# Patient Record
Sex: Female | Born: 1955 | Race: White | Hispanic: No | Marital: Married | Smoking: Former smoker
Health system: Southern US, Community
[De-identification: ages and names within clinical notes are randomized; demographics above are authoritative.]

## PROBLEM LIST (undated history)

## (undated) DIAGNOSIS — I639 Cerebral infarction, unspecified: Secondary | ICD-10-CM

## (undated) DIAGNOSIS — E119 Type 2 diabetes mellitus without complications: Secondary | ICD-10-CM

## (undated) DIAGNOSIS — K219 Gastro-esophageal reflux disease without esophagitis: Secondary | ICD-10-CM

## (undated) DIAGNOSIS — N184 Chronic kidney disease, stage 4 (severe): Secondary | ICD-10-CM

## (undated) DIAGNOSIS — G43909 Migraine, unspecified, not intractable, without status migrainosus: Secondary | ICD-10-CM

## (undated) DIAGNOSIS — E1129 Type 2 diabetes mellitus with other diabetic kidney complication: Secondary | ICD-10-CM

## (undated) DIAGNOSIS — I10 Essential (primary) hypertension: Secondary | ICD-10-CM

## (undated) DIAGNOSIS — E785 Hyperlipidemia, unspecified: Secondary | ICD-10-CM

## (undated) DIAGNOSIS — N289 Disorder of kidney and ureter, unspecified: Secondary | ICD-10-CM

## (undated) HISTORY — PX: APPENDECTOMY: SHX54

## (undated) HISTORY — PX: ABDOMINAL HYSTERECTOMY: SHX81

## (undated) HISTORY — PX: BREAST LUMPECTOMY: SHX2

## (undated) HISTORY — PX: BREAST EXCISIONAL BIOPSY: SUR124

---
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
## 2019-10-15 ENCOUNTER — Emergency Department
Admission: EM | Admit: 2019-10-15 | Discharge: 2019-10-15 | Disposition: A | Discharge status: Home / Self Care | Payer: Medicare HMO | Attending: Emergency Medicine | Admitting: Emergency Medicine

## 2019-10-15 ENCOUNTER — Other Ambulatory Visit: Payer: Self-pay

## 2019-10-15 ENCOUNTER — Emergency Department: Payer: Medicare HMO

## 2019-10-15 DIAGNOSIS — R531 Weakness: Secondary | ICD-10-CM

## 2019-10-15 DIAGNOSIS — Z87891 Personal history of nicotine dependence: Secondary | ICD-10-CM | POA: Insufficient documentation

## 2019-10-15 DIAGNOSIS — F1210 Cannabis abuse, uncomplicated: Secondary | ICD-10-CM | POA: Insufficient documentation

## 2019-10-15 DIAGNOSIS — I10 Essential (primary) hypertension: Secondary | ICD-10-CM | POA: Insufficient documentation

## 2019-10-15 DIAGNOSIS — E119 Type 2 diabetes mellitus without complications: Secondary | ICD-10-CM | POA: Insufficient documentation

## 2019-10-15 HISTORY — DX: Essential (primary) hypertension: I10

## 2019-10-15 LAB — BASIC METABOLIC PANEL
Anion gap: 6 (ref 5–15)
BUN: 30 mg/dL — ABNORMAL HIGH (ref 8–23)
CO2: 24 mmol/L (ref 22–32)
Calcium: 8.2 mg/dL — ABNORMAL LOW (ref 8.9–10.3)
Chloride: 110 mmol/L (ref 98–111)
Creatinine, Ser: 1.41 mg/dL — ABNORMAL HIGH (ref 0.44–1.00)
GFR calc Af Amer: 45 mL/min — ABNORMAL LOW (ref >60)
GFR calc non Af Amer: 39 mL/min — ABNORMAL LOW (ref >60)
Glucose, Bld: 95 mg/dL (ref 70–99)
Potassium: 3.8 mmol/L (ref 3.5–5.1)
Sodium: 140 mmol/L (ref 135–145)

## 2019-10-15 LAB — GLUCOSE, CAPILLARY
Glucose-Capillary: 126 mg/dL — ABNORMAL HIGH (ref 70–99)
Glucose-Capillary: 47 mg/dL — ABNORMAL LOW (ref 70–99)

## 2019-10-15 LAB — TROPONIN I (HIGH SENSITIVITY)
Troponin I (High Sensitivity): 3 ng/L (ref <18)
Troponin I (High Sensitivity): 4 ng/L (ref <18)

## 2019-10-15 MED ORDER — DEXTROSE 50 % IV SOLN
INTRAVENOUS | Status: AC
  Filled 2019-10-15: qty 50

## 2019-10-15 MED ORDER — DEXTROSE 50 % IV SOLN
1.0000 | Freq: Once | INTRAVENOUS | Status: AC
  Administered 2019-10-15: 50 mL via INTRAVENOUS

## 2019-10-15 MED ORDER — SODIUM CHLORIDE 0.9 % IV BOLUS
1000.0000 mL | Freq: Once | INTRAVENOUS | Status: AC
  Administered 2019-10-15: 1000 mL via INTRAVENOUS

## 2019-10-15 NOTE — ED Notes (Signed)
Assumed care of patient reports went to take her son to an appoint over at the clinic and was feeling dizzy. Was noted to having low b/p and low glucose. Patient reports eating this morning with known history of high blood glucose  and high b/p.

## 2019-10-15 NOTE — ED Provider Notes (Signed)
[facility] Emergency Department Provider Note  Time seen: 1:46 PM  I have reviewed the triage vital signs and the nursing notes.   HISTORY  Chief Complaint Weakness Hypotension  HPI Mariah Forbes is a 64 y.o. female with a past medical history of diabetes, hypertension, presents to the emergency department for a low blood sugar and low blood pressure.  According to the patient for the past week or so she has been feeling somewhat fatigued.  States she was experiencing a vomiting and diarrheal illness 1 week ago but has been taking Zofran and has been feeling better since then.  Patient states she was feeling somewhat shaky this morning her son had an appointment at 930 this morning to be seen at [facility] clinic so she went with her son to be seen as well.  Patient was found to have a lower blood sugar of 39 at the clinic as well as a lower blood pressure around 90 systolic.  Patient was brought to the emergency department for further work-up.  Patient's blood pressure had improved without intervention upon arrival to the emergency department.  Blood sugar remained low.  Patient states she did eat a biscuit this morning.  Took her glipizide this morning and Lantus last night.  Denies any fever cough or shortness of breath.  Patient had lab work performed at [facility] prior to transfer to the emergency department.   Past Medical History:  Diagnosis Date  . Diabetes mellitus without complication ([place])   . Hypertension   . Renal disorder     There are no problems to display for this patient.   Past Surgical History:  Procedure Laterality Date  . ABDOMINAL HYSTERECTOMY    . APPENDECTOMY    . BREAST LUMPECTOMY Right     Prior to Admission medications   Not on File    No Known Allergies  History reviewed. No pertinent family history.  Social History Social History   Tobacco Use  . Smoking status: Former Research scientist (life sciences)  . Smokeless tobacco: Never Used   Substance Use Topics  . Alcohol use: Yes  . Drug use: Yes    Frequency: 1.0 times per week    Types: Marijuana    Review of Systems Constitutional: Negative for fever. Cardiovascular: Negative for chest pain. Respiratory: Negative for shortness of breath. Gastrointestinal: Negative for abdominal pain.  Nausea vomiting last week but none since.  Negative for diarrhea. Genitourinary: Negative for urinary compaints Musculoskeletal: Negative for musculoskeletal complaints Neurological: Negative for headache All other ROS negative  ____________________________________________   PHYSICAL EXAM:  VITAL SIGNS: ED Triage Vitals  Enc Vitals Group     BP 10/15/19 1145 122/61     Pulse Rate 10/15/19 1145 70     Resp 10/15/19 1145 18     Temp 10/15/19 1145 97.9 F (36.6 C)     Temp Source 10/15/19 1145 Oral     SpO2 10/15/19 1145 100 %     Weight 10/15/19 1146 200 lb (90.7 kg)     Height 10/15/19 1146 5\' 3"  (1.6 m)     Head Circumference --      Peak Flow --      Pain Score 10/15/19 1146 0     Pain Loc --      Pain Edu? --      Excl. in [place]? --    Constitutional: Alert and oriented. Well appearing and in no distress. Eyes: Normal exam ENT      Head: Normocephalic  and atraumatic.      Mouth/Throat: Mucous membranes are moist. Cardiovascular: Normal rate, regular rhythm. Respiratory: Normal respiratory effort without tachypnea nor retractions. Breath sounds are clear Gastrointestinal: Soft and nontender. No distention.  Musculoskeletal: Nontender with normal range of motion in all extremities. Neurologic:  Normal speech and language. No gross focal neurologic deficits  Skin:  Skin is warm, dry and intact.  Psychiatric: Mood and affect are normal.   ____________________________________________    EKG  EKG viewed and interpreted by myself shows a sinus rhythm at 69 bpm with a narrow QRS, normal axis, normal intervals, besides slightly prolonged PR interval, no ST  changes.  ____________________________________________    RADIOLOGY  IMPRESSION:  Lungs clear. Cardiac silhouette within normal limits.   ____________________________________________   INITIAL IMPRESSION / ASSESSMENT AND PLAN / ED COURSE  Pertinent labs & imaging results that were available during my care of the patient were reviewed by me and considered in my medical decision making (see chart for details).   Patient presents from [facility] with low blood sugar and low blood pressure.  Blood pressure had improved without intervention.  Patient's blood sugar was low given an amp of D50 and orange juice.  Patient given a liter of fluids in the emergency department.  I reviewed the patient's lab work showed renal insufficiency with a creatinine 1.5 however this is largely unchanged from patient's past lab work from 2019 and 18.  Patient's blood sugar was low, however on CBG recheck it is 126 after intervention.  Patient states she is feeling much better.  We will check a urine sample.  Patient received a Covid swab this morning at [facility].  States she has been experiencing some mild shortness of breath over the past week or 2 but denies any currently.  Denies any chest pain at any point.  Troponin is negative.  EKG is reassuring.  We will recheck a BMP after 1 L of fluids, check a chest x-ray, and continue to closely monitor.  Patient states she is feeling much better and is anxious to get home to continue on packing as she just moved to the area yesterday.  Patient is chest x-ray is clear.  BMP is improved blood glucose remains greater than 90.  Patient is requesting discharge home.  I believe the patient is safe for discharge home.  I discussed with the patient plenty of fluids and she needs to continue to check her blood glucose at home.  Patient agreeable to plan of care.  Mariah Forbes was evaluated in Emergency Department on 10/15/2019 for the symptoms described in the  history of present illness. She was evaluated in the context of the global COVID-19 pandemic, which necessitated consideration that the patient might be at risk for infection with the SARS-CoV-2 virus that causes COVID-19. Institutional protocols and algorithms that pertain to the evaluation of patients at risk for COVID-19 are in a state of rapid change based on information released by regulatory bodies including the CDC and federal and state organizations. These policies and algorithms were followed during the patient's care in the ED.  ____________________________________________   FINAL CLINICAL IMPRESSION(S) / ED DIAGNOSES  Hypoglycemia Weakness   Harvest Dark, MD 10/15/19 1427

## 2019-10-15 NOTE — ED Triage Notes (Signed)
First Nurse Note:  Arrives from [facility] for evaluation of elevated WBC and Hypotension.  Patient is AAOx3. Skin warm and dry. NAD

## 2019-10-15 NOTE — ED Notes (Signed)
Labs obtained and sent.

## 2019-10-15 NOTE — Discharge Instructions (Signed)
As discussed please drink plenty of fluids.  Please eat throughout the day to maintain your blood glucose.  Please check your blood glucose at least 4 times daily for the next 2 days.  Return to the emergency department for any episodes of sustained low blood sugar, any further weakness, or any other symptom personally concerning to yourself.

## 2019-10-15 NOTE — ED Notes (Addendum)
Orders per Dr. Kerman Passey since pt labs done at [facility] about an hour ago.

## 2019-10-15 NOTE — ED Notes (Signed)
Pt given one cup orange juice.

## 2019-10-15 NOTE — ED Triage Notes (Signed)
Pt comes for [facility] with hypotension, low CBG at 39 per pt, dizziness. Pt AOx4 at this time.

## 2019-11-13 ENCOUNTER — Other Ambulatory Visit: Payer: Self-pay

## 2019-11-13 ENCOUNTER — Ambulatory Visit: Payer: Medicare HMO | Attending: Internal Medicine

## 2019-11-13 DIAGNOSIS — Z23 Encounter for immunization: Secondary | ICD-10-CM

## 2019-11-13 NOTE — Progress Notes (Signed)
   Covid-19 Vaccination Clinic  Name:  Mariah Forbes    MRN: [id number] DOB: 07-30-1955  11/13/2019  [name] was observed post Covid-19 immunization for 15 minutes without incident. She was provided with Vaccine Information Sheet and instruction to access the V-Safe system.   [name] was instructed to call 911 with any severe reactions post vaccine: [name] Kitchen Difficulty breathing  . Swelling of face and throat  . A fast heartbeat  . A bad rash all over body  . Dizziness and weakness   Immunizations Administered    Name Date Dose VIS Date Route   Pfizer COVID-19 Vaccine 11/13/2019 10:49 AM 0.3 mL 09/10/2018 Intramuscular   Manufacturer: [employer], [employer]   Lot: [id number]   [place]: [id number]

## 2019-11-18 ENCOUNTER — Other Ambulatory Visit: Payer: Self-pay | Admitting: Gastroenterology

## 2019-11-18 DIAGNOSIS — R1319 Other dysphagia: Secondary | ICD-10-CM

## 2019-11-18 DIAGNOSIS — R1310 Dysphagia, unspecified: Secondary | ICD-10-CM

## 2019-11-24 ENCOUNTER — Ambulatory Visit
Admission: RE | Admit: 2019-11-24 | Discharge: 2019-11-24 | Disposition: A | Discharge status: Home / Self Care | Payer: Medicare HMO | Source: Ambulatory Visit | Attending: Gastroenterology | Admitting: Gastroenterology

## 2019-11-24 ENCOUNTER — Other Ambulatory Visit: Payer: Self-pay

## 2019-11-24 DIAGNOSIS — R1310 Dysphagia, unspecified: Secondary | ICD-10-CM | POA: Diagnosis not present

## 2019-11-24 DIAGNOSIS — R1319 Other dysphagia: Secondary | ICD-10-CM

## 2019-12-09 ENCOUNTER — Ambulatory Visit: Payer: Medicare HMO | Attending: Internal Medicine

## 2019-12-09 DIAGNOSIS — Z23 Encounter for immunization: Secondary | ICD-10-CM

## 2019-12-09 NOTE — Progress Notes (Signed)
° °  Covid-19 Vaccination Clinic  Name:  Mariah Forbes    MRN: [id number] DOB: 11-04-1955  12/09/2019  Ms. Mariah Forbes was observed post Covid-19 immunization for 15 minutes without incident. She was provided with Vaccine Information Sheet and instruction to access the V-Safe system.   Ms. Mariah Forbes was instructed to call 911 with any severe reactions post vaccine:  Difficulty breathing   Swelling of face and throat   A fast heartbeat   A bad rash all over body   Dizziness and weakness   Immunizations Administered    Name Date Dose VIS Date Route   Pfizer COVID-19 Vaccine 12/09/2019 10:13 AM 0.3 mL 09/10/2018 Intramuscular   Manufacturer: [place]   Lot: [id number]   [place]: [id number]

## 2020-02-09 DIAGNOSIS — E1165 Type 2 diabetes mellitus with hyperglycemia: Secondary | ICD-10-CM | POA: Diagnosis not present

## 2020-02-09 DIAGNOSIS — L659 Nonscarring hair loss, unspecified: Secondary | ICD-10-CM | POA: Diagnosis not present

## 2020-02-09 DIAGNOSIS — Z1231 Encounter for screening mammogram for malignant neoplasm of breast: Secondary | ICD-10-CM | POA: Diagnosis not present

## 2020-02-09 DIAGNOSIS — Z114 Encounter for screening for human immunodeficiency virus [HIV]: Secondary | ICD-10-CM | POA: Diagnosis not present

## 2020-02-09 DIAGNOSIS — Z794 Long term (current) use of insulin: Secondary | ICD-10-CM | POA: Diagnosis not present

## 2020-02-09 DIAGNOSIS — R42 Dizziness and giddiness: Secondary | ICD-10-CM | POA: Diagnosis not present

## 2020-02-09 DIAGNOSIS — E0865 Diabetes mellitus due to underlying condition with hyperglycemia: Secondary | ICD-10-CM | POA: Diagnosis not present

## 2020-02-09 DIAGNOSIS — I10 Essential (primary) hypertension: Secondary | ICD-10-CM | POA: Diagnosis not present

## 2020-02-09 DIAGNOSIS — Z1159 Encounter for screening for other viral diseases: Secondary | ICD-10-CM | POA: Diagnosis not present

## 2020-02-12 ENCOUNTER — Other Ambulatory Visit: Payer: Self-pay | Admitting: Internal Medicine

## 2020-02-12 DIAGNOSIS — Z1231 Encounter for screening mammogram for malignant neoplasm of breast: Secondary | ICD-10-CM

## 2020-03-01 DIAGNOSIS — E113313 Type 2 diabetes mellitus with moderate nonproliferative diabetic retinopathy with macular edema, bilateral: Secondary | ICD-10-CM | POA: Diagnosis not present

## 2020-03-03 ENCOUNTER — Other Ambulatory Visit: Payer: Self-pay

## 2020-03-03 ENCOUNTER — Ambulatory Visit
Admission: RE | Admit: 2020-03-03 | Discharge: 2020-03-03 | Disposition: A | Discharge status: Home / Self Care | Payer: Medicare HMO | Source: Ambulatory Visit | Attending: Internal Medicine | Admitting: Internal Medicine

## 2020-03-03 DIAGNOSIS — Z1231 Encounter for screening mammogram for malignant neoplasm of breast: Secondary | ICD-10-CM | POA: Diagnosis not present

## 2020-04-12 DIAGNOSIS — N3281 Overactive bladder: Secondary | ICD-10-CM | POA: Diagnosis not present

## 2020-04-12 DIAGNOSIS — M545 Low back pain: Secondary | ICD-10-CM | POA: Diagnosis not present

## 2020-04-12 DIAGNOSIS — G8929 Other chronic pain: Secondary | ICD-10-CM | POA: Diagnosis not present

## 2020-04-12 DIAGNOSIS — R202 Paresthesia of skin: Secondary | ICD-10-CM | POA: Diagnosis not present

## 2020-04-12 DIAGNOSIS — E559 Vitamin D deficiency, unspecified: Secondary | ICD-10-CM | POA: Diagnosis not present

## 2020-04-12 DIAGNOSIS — Z794 Long term (current) use of insulin: Secondary | ICD-10-CM | POA: Diagnosis not present

## 2020-04-12 DIAGNOSIS — E1122 Type 2 diabetes mellitus with diabetic chronic kidney disease: Secondary | ICD-10-CM | POA: Diagnosis not present

## 2020-04-12 DIAGNOSIS — N1831 Chronic kidney disease, stage 3a: Secondary | ICD-10-CM | POA: Diagnosis not present

## 2020-04-29 DIAGNOSIS — Z20822 Contact with and (suspected) exposure to covid-19: Secondary | ICD-10-CM | POA: Diagnosis not present

## 2020-04-29 DIAGNOSIS — Z01812 Encounter for preprocedural laboratory examination: Secondary | ICD-10-CM | POA: Diagnosis not present

## 2020-05-04 ENCOUNTER — Other Ambulatory Visit: Payer: Self-pay

## 2020-05-04 ENCOUNTER — Emergency Department
Admission: EM | Admit: 2020-05-04 | Discharge: 2020-05-04 | Disposition: A | Discharge status: Home / Self Care | Payer: Medicare HMO | Attending: Emergency Medicine | Admitting: Emergency Medicine

## 2020-05-04 DIAGNOSIS — Z87891 Personal history of nicotine dependence: Secondary | ICD-10-CM | POA: Diagnosis not present

## 2020-05-04 DIAGNOSIS — F1590 Other stimulant use, unspecified, uncomplicated: Secondary | ICD-10-CM | POA: Insufficient documentation

## 2020-05-04 DIAGNOSIS — E1165 Type 2 diabetes mellitus with hyperglycemia: Secondary | ICD-10-CM | POA: Diagnosis not present

## 2020-05-04 DIAGNOSIS — I10 Essential (primary) hypertension: Secondary | ICD-10-CM | POA: Diagnosis not present

## 2020-05-04 DIAGNOSIS — R197 Diarrhea, unspecified: Secondary | ICD-10-CM | POA: Insufficient documentation

## 2020-05-04 DIAGNOSIS — E860 Dehydration: Secondary | ICD-10-CM

## 2020-05-04 DIAGNOSIS — R739 Hyperglycemia, unspecified: Secondary | ICD-10-CM

## 2020-05-04 LAB — CBC
HCT: 40.6 % (ref 36.0–46.0)
Hemoglobin: 13.3 g/dL (ref 12.0–15.0)
MCH: 28.9 pg (ref 26.0–34.0)
MCHC: 32.8 g/dL (ref 30.0–36.0)
MCV: 88.1 fL (ref 80.0–100.0)
Platelets: 227 10*3/uL (ref 150–400)
RBC: 4.61 MIL/uL (ref 3.87–5.11)
RDW: 13.1 % (ref 11.5–15.5)
WBC: 11.4 10*3/uL — ABNORMAL HIGH (ref 4.0–10.5)
nRBC: 0 % (ref 0.0–0.2)

## 2020-05-04 LAB — BLOOD GAS, VENOUS
Acid-base deficit: 3.5 mmol/L — ABNORMAL HIGH (ref 0.0–2.0)
Bicarbonate: 22.1 mmol/L (ref 20.0–28.0)
FIO2: 0.21
O2 Saturation: 62.9 %
Patient temperature: 37
pCO2, Ven: 41 mmHg — ABNORMAL LOW (ref 44.0–60.0)
pH, Ven: 7.34 (ref 7.250–7.430)
pO2, Ven: 35 mmHg (ref 32.0–45.0)

## 2020-05-04 LAB — BASIC METABOLIC PANEL WITH GFR
Anion gap: 15 (ref 5–15)
BUN: 18 mg/dL (ref 8–23)
CO2: 19 mmol/L — ABNORMAL LOW (ref 22–32)
Calcium: 9.1 mg/dL (ref 8.9–10.3)
Chloride: 101 mmol/L (ref 98–111)
Creatinine, Ser: 1.36 mg/dL — ABNORMAL HIGH (ref 0.44–1.00)
GFR, Estimated: 41 mL/min — ABNORMAL LOW
Glucose, Bld: 534 mg/dL (ref 70–99)
Potassium: 3.6 mmol/L (ref 3.5–5.1)
Sodium: 135 mmol/L (ref 135–145)

## 2020-05-04 LAB — GLUCOSE, CAPILLARY: Glucose-Capillary: 456 mg/dL — ABNORMAL HIGH (ref 70–99)

## 2020-05-04 LAB — BETA-HYDROXYBUTYRIC ACID: Beta-Hydroxybutyric Acid: 0.71 mmol/L — ABNORMAL HIGH (ref 0.05–0.27)

## 2020-05-04 MED ORDER — LACTATED RINGERS IV BOLUS
1000.0000 mL | Freq: Once | INTRAVENOUS | Status: AC
  Administered 2020-05-04: 1000 mL via INTRAVENOUS

## 2020-05-04 MED ORDER — INSULIN ASPART 100 UNIT/ML ~~LOC~~ SOLN
15.0000 [IU] | Freq: Once | SUBCUTANEOUS | Status: DC
  Filled 2020-05-04: qty 1

## 2020-05-04 MED ORDER — INSULIN ASPART 100 UNIT/ML ~~LOC~~ SOLN: 0.0000 [IU] | SUBCUTANEOUS | Status: DC

## 2020-05-04 MED ORDER — INSULIN ASPART 100 UNIT/ML ~~LOC~~ SOLN
15.0000 [IU] | Freq: Once | SUBCUTANEOUS | Status: AC
  Administered 2020-05-04: 15 [IU] via SUBCUTANEOUS

## 2020-05-04 NOTE — ED Notes (Signed)
Pt states her CBG was high when she went for her colonoscopy. Pt states she hasn't had it in couple of days due to insurance not paying for it any longer. Pt states her PCP was going to change to different kind. Pt denies any other complaints.

## 2020-05-04 NOTE — ED Triage Notes (Signed)
Pt arrived via [facility] from GI clinic, pt states she was ready for a colonoscopy and her sugar at the office was 592.  Pt reports she has not taken her diabetes medications in 2 days.  Pt has been NPO for the procedure.

## 2020-05-04 NOTE — ED Notes (Signed)
CBG 531

## 2020-05-04 NOTE — ED Provider Notes (Signed)
[facility] Emergency Department Provider Note  ____________________________________________   First MD Initiated Contact with Patient 05/04/20 1335     (approximate)  I have reviewed the triage vital signs and the nursing notes.   HISTORY  Chief Complaint Hyperglycemia   HPI Mariah Forbes is a 64 y.o. female with a past medical history of HTN and  DM who presents from GI clinic where she was initially scheduled for an outpatient colonoscopy after her blood sugar was found to be 592.  Patient reports she has not taken her diabetes medications for 2 days for this procedure and has been n.p.o. since last night. She states she normally takes [place] and Victoza for her diabetes. She states other than fatigue and some increase in urinary frequency she has no other acute symptoms including headache, earache, sore throat, fevers, chills, cough, shortness of breath, nausea, vomiting, chest pain, abdominal pain, back pain, rash, extremity pain, or dysuria. She states that having completed her bowel prep she has been having some diarrhea over the last 24 hours but no blood. Denies EtOH or illicit drug use. No other acute concerns at this time. That she has an appointment with her PCP for next week.         Past Medical History:  Diagnosis Date  . Diabetes mellitus without complication ([place])   . Hypertension   . Renal disorder     There are no problems to display for this patient.   Past Surgical History:  Procedure Laterality Date  . ABDOMINAL HYSTERECTOMY    . APPENDECTOMY    . BREAST EXCISIONAL BIOPSY Bilateral    2 benign lumps, 1980 and 2015  . BREAST LUMPECTOMY Right     Prior to Admission medications   Not on File    Allergies Ace inhibitors  Family History  Problem Relation Age of Onset  . Breast cancer Neg Hx     Social History Social History   Tobacco Use  . Smoking status: Former Research scientist (life sciences)  . Smokeless tobacco: Never Used    Substance Use Topics  . Alcohol use: Yes  . Drug use: Yes    Frequency: 1.0 times per week    Types: Marijuana    Review of Systems  Review of Systems  Constitutional: Negative for chills and fever.  HENT: Negative for sore throat.   Eyes: Negative for pain.  Respiratory: Negative for cough and stridor.   Cardiovascular: Negative for chest pain.  Gastrointestinal: Positive for diarrhea. Negative for vomiting.  Genitourinary: Positive for frequency.  Musculoskeletal: Negative for myalgias.  Skin: Negative for rash.  Neurological: Negative for seizures, loss of consciousness and headaches.  Psychiatric/Behavioral: Negative for suicidal ideas.  All other systems reviewed and are negative.     ____________________________________________   PHYSICAL EXAM:  VITAL SIGNS: ED Triage Vitals  Enc Vitals Group     BP 05/04/20 1206 (!) 142/79     Pulse Rate 05/04/20 1206 86     Resp 05/04/20 1206 18     Temp 05/04/20 1206 98.2 F (36.8 C)     Temp Source 05/04/20 1206 Oral     SpO2 05/04/20 1206 100 %     Weight 05/04/20 1202 200 lb (90.7 kg)     Height 05/04/20 1202 5\' 3"  (1.6 m)     Head Circumference --      Peak Flow --      Pain Score 05/04/20 1202 0     Pain Loc --  Pain Edu? --      Excl. in [place]? --    Vitals:   05/04/20 1206  BP: (!) 142/79  Pulse: 86  Resp: 18  Temp: 98.2 F (36.8 C)  SpO2: 100%   Physical Exam Vitals and nursing note reviewed.  Constitutional:      General: She is not in acute distress.    Appearance: She is well-developed. She is obese.  HENT:     Head: Normocephalic and atraumatic.     Right Ear: External ear normal.     Left Ear: External ear normal.     Nose: Nose normal.     Mouth/Throat:     Mouth: Mucous membranes are dry.  Eyes:     Conjunctiva/sclera: Conjunctivae normal.  Cardiovascular:     Rate and Rhythm: Normal rate and regular rhythm.     Heart sounds: No murmur heard.   Pulmonary:     Effort: Pulmonary  effort is normal. No respiratory distress.     Breath sounds: Normal breath sounds.  Abdominal:     Palpations: Abdomen is soft.     Tenderness: There is no abdominal tenderness.  Musculoskeletal:     Cervical back: Neck supple.  Skin:    General: Skin is warm and dry.     Capillary Refill: Capillary refill takes 2 to 3 seconds.  Neurological:     Mental Status: She is alert and oriented to person, place, and time.  Psychiatric:        Mood and Affect: Mood normal.      ____________________________________________   LABS (all labs ordered are listed, but only abnormal results are displayed)  Labs Reviewed  BASIC METABOLIC PANEL - Abnormal; Notable for the following components:      Result Value   CO2 19 (*)    Glucose, Bld 534 (*)    Creatinine, Ser 1.36 (*)    GFR, Estimated 41 (*)    All other components within normal limits  CBC - Abnormal; Notable for the following components:   WBC 11.4 (*)    All other components within normal limits  BLOOD GAS, VENOUS - Abnormal; Notable for the following components:   pCO2, Ven 41 (*)    Acid-base deficit 3.5 (*)    All other components within normal limits  BETA-HYDROXYBUTYRIC ACID - Abnormal; Notable for the following components:   Beta-Hydroxybutyric Acid 0.71 (*)    All other components within normal limits  GLUCOSE, CAPILLARY - Abnormal; Notable for the following components:   Glucose-Capillary 456 (*)    All other components within normal limits  URINALYSIS, COMPLETE (UACMP) WITH MICROSCOPIC  CBG MONITORING, ED  CBG MONITORING, ED   ____________________________________________ ____________________________________________   PROCEDURES  Procedure(s) performed (including Critical Care):  Procedures   ____________________________________________   INITIAL IMPRESSION / ASSESSMENT AND PLAN / ED COURSE        Patient presents with above-stated history exam for assessment of elevated blood sugar obtained at GI  clinic prior to outpatient colonoscopy procedure.  Patient is hypertensive with a BP of 142/79 with otherwise stable vital signs on room air.  Labs obtained in triage show evidence of hyperglycemia with a glucose of 534 with a bicarb of 19 and a anion gap of 15.  Patient's creatinine is noted to be 1.36 which is compared to 1.46 months ago.  Patient has no other significant electrolyte or metabolic derangements.  Hydroxybutyrate is 0.71.  VBG shows no significant acidosis his pH is 7.34 with a  PCO2 of 41 and a bicarb of 22.1.  Given near normal pH with anion gap less than 15 and bicarb greater than 18 low suspicion for clinically significant DKA.  Patient does appear dehydrated on exam and she was given fluids in addition to insulin.  Likely etiology for patient's hyperglycemia is patient not taking her medications for last 2 days.  Patient denies any acute infectious symptoms and there are no obvious foci of infection on exam.  She denies any chest pain shortness of breath and of low suspicion for ACS at this time.  She has not recently been on any steroids.  On recheck patient's blood sugars were noted to be downtrending.  I explained to the patient that the likely source for her hyperglycemia and dehydration today was related to her not taking her diabetes medications and being n.p.o. and taking a bowel prep.  Advised patient to follow-up with her PCP as she will likely require closer monitoring and/or modification of her diabetes medications prior to repeat colonoscopy attempt although she should also reach out to her GI physician for further guidance.   Given stable vital signs with otherwise reassuring exam and work-up and downtrending glucose I do believe patient safe for discharge with plan for outpatient follow-up.  Advised patient is to continue taking her diabetes medications and other prescribed medications and follow-up with her PCP and GI physician.  Discharged stable  condition.  ____________________________________________   FINAL CLINICAL IMPRESSION(S) / ED DIAGNOSES  Final diagnoses:  Hyperglycemia  Dehydration    Medications  insulin aspart (novoLOG) injection 0-15 Units (has no administration in time range)  lactated ringers bolus 1,000 mL (1,000 mLs Intravenous New Bag/Given 05/04/20 1406)  insulin aspart (novoLOG) injection 15 Units (15 Units Subcutaneous Given 05/04/20 1414)     ED Discharge Orders    None       Note:  This document was prepared using Dragon voice recognition software and may include unintentional dictation errors.   Lucrezia Starch, MD 05/04/20 1425

## 2020-05-04 NOTE — ED Notes (Signed)
Tried for UA, pt missed hat.

## 2020-05-05 LAB — GLUCOSE, CAPILLARY: Glucose-Capillary: 531 mg/dL (ref 70–99)

## 2020-05-10 ENCOUNTER — Ambulatory Visit: Payer: Self-pay | Admitting: Urology

## 2020-05-10 ENCOUNTER — Ambulatory Visit: Payer: Medicare HMO | Admitting: Urology

## 2020-05-10 DIAGNOSIS — E119 Type 2 diabetes mellitus without complications: Secondary | ICD-10-CM | POA: Diagnosis not present

## 2020-05-10 DIAGNOSIS — Z23 Encounter for immunization: Secondary | ICD-10-CM | POA: Diagnosis not present

## 2020-05-10 DIAGNOSIS — N3941 Urge incontinence: Secondary | ICD-10-CM | POA: Diagnosis not present

## 2020-05-10 DIAGNOSIS — Z794 Long term (current) use of insulin: Secondary | ICD-10-CM | POA: Diagnosis not present

## 2020-05-10 DIAGNOSIS — G8929 Other chronic pain: Secondary | ICD-10-CM | POA: Diagnosis not present

## 2020-05-10 DIAGNOSIS — M545 Low back pain, unspecified: Secondary | ICD-10-CM | POA: Diagnosis not present

## 2020-05-23 DIAGNOSIS — R112 Nausea with vomiting, unspecified: Secondary | ICD-10-CM | POA: Diagnosis not present

## 2020-05-23 DIAGNOSIS — E1165 Type 2 diabetes mellitus with hyperglycemia: Secondary | ICD-10-CM | POA: Diagnosis not present

## 2020-05-23 DIAGNOSIS — R5383 Other fatigue: Secondary | ICD-10-CM | POA: Diagnosis not present

## 2020-05-23 DIAGNOSIS — I129 Hypertensive chronic kidney disease with stage 1 through stage 4 chronic kidney disease, or unspecified chronic kidney disease: Secondary | ICD-10-CM | POA: Diagnosis not present

## 2020-05-23 DIAGNOSIS — E871 Hypo-osmolality and hyponatremia: Secondary | ICD-10-CM | POA: Diagnosis not present

## 2020-05-23 DIAGNOSIS — I499 Cardiac arrhythmia, unspecified: Secondary | ICD-10-CM | POA: Diagnosis not present

## 2020-05-23 DIAGNOSIS — R21 Rash and other nonspecific skin eruption: Secondary | ICD-10-CM | POA: Diagnosis not present

## 2020-05-23 DIAGNOSIS — E1122 Type 2 diabetes mellitus with diabetic chronic kidney disease: Secondary | ICD-10-CM | POA: Diagnosis not present

## 2020-05-23 DIAGNOSIS — N184 Chronic kidney disease, stage 4 (severe): Secondary | ICD-10-CM | POA: Diagnosis not present

## 2020-05-23 DIAGNOSIS — K589 Irritable bowel syndrome without diarrhea: Secondary | ICD-10-CM | POA: Diagnosis not present

## 2020-05-23 DIAGNOSIS — R42 Dizziness and giddiness: Secondary | ICD-10-CM | POA: Diagnosis not present

## 2020-05-28 DIAGNOSIS — R42 Dizziness and giddiness: Secondary | ICD-10-CM | POA: Diagnosis not present

## 2020-05-28 DIAGNOSIS — E1122 Type 2 diabetes mellitus with diabetic chronic kidney disease: Secondary | ICD-10-CM | POA: Diagnosis not present

## 2020-05-28 DIAGNOSIS — Z794 Long term (current) use of insulin: Secondary | ICD-10-CM | POA: Diagnosis not present

## 2020-05-28 DIAGNOSIS — E1142 Type 2 diabetes mellitus with diabetic polyneuropathy: Secondary | ICD-10-CM | POA: Diagnosis not present

## 2020-05-28 DIAGNOSIS — E1165 Type 2 diabetes mellitus with hyperglycemia: Secondary | ICD-10-CM | POA: Diagnosis not present

## 2020-05-28 DIAGNOSIS — N1831 Chronic kidney disease, stage 3a: Secondary | ICD-10-CM | POA: Diagnosis not present

## 2020-05-28 DIAGNOSIS — I129 Hypertensive chronic kidney disease with stage 1 through stage 4 chronic kidney disease, or unspecified chronic kidney disease: Secondary | ICD-10-CM | POA: Diagnosis not present

## 2020-06-02 DIAGNOSIS — I252 Old myocardial infarction: Secondary | ICD-10-CM | POA: Diagnosis not present

## 2020-06-02 DIAGNOSIS — E1159 Type 2 diabetes mellitus with other circulatory complications: Secondary | ICD-10-CM | POA: Diagnosis not present

## 2020-06-02 DIAGNOSIS — E1169 Type 2 diabetes mellitus with other specified complication: Secondary | ICD-10-CM | POA: Diagnosis not present

## 2020-06-02 DIAGNOSIS — E113293 Type 2 diabetes mellitus with mild nonproliferative diabetic retinopathy without macular edema, bilateral: Secondary | ICD-10-CM | POA: Diagnosis not present

## 2020-06-02 DIAGNOSIS — E049 Nontoxic goiter, unspecified: Secondary | ICD-10-CM | POA: Diagnosis not present

## 2020-06-02 DIAGNOSIS — E1165 Type 2 diabetes mellitus with hyperglycemia: Secondary | ICD-10-CM | POA: Diagnosis not present

## 2020-06-02 DIAGNOSIS — Z794 Long term (current) use of insulin: Secondary | ICD-10-CM | POA: Diagnosis not present

## 2020-06-02 DIAGNOSIS — E669 Obesity, unspecified: Secondary | ICD-10-CM | POA: Diagnosis not present

## 2020-06-07 DIAGNOSIS — N1831 Chronic kidney disease, stage 3a: Secondary | ICD-10-CM | POA: Diagnosis not present

## 2020-06-07 DIAGNOSIS — E1143 Type 2 diabetes mellitus with diabetic autonomic (poly)neuropathy: Secondary | ICD-10-CM | POA: Diagnosis not present

## 2020-06-07 DIAGNOSIS — N3281 Overactive bladder: Secondary | ICD-10-CM | POA: Diagnosis not present

## 2020-06-07 DIAGNOSIS — I10 Essential (primary) hypertension: Secondary | ICD-10-CM | POA: Diagnosis not present

## 2020-06-07 DIAGNOSIS — E1165 Type 2 diabetes mellitus with hyperglycemia: Secondary | ICD-10-CM | POA: Diagnosis not present

## 2020-06-07 DIAGNOSIS — E1122 Type 2 diabetes mellitus with diabetic chronic kidney disease: Secondary | ICD-10-CM | POA: Diagnosis not present

## 2020-06-07 DIAGNOSIS — Z794 Long term (current) use of insulin: Secondary | ICD-10-CM | POA: Diagnosis not present

## 2020-06-21 IMAGING — RF DG ESOPHAGUS
8 of 10 series · 14 of 24 positions shown · non-contrast
Comparison: None.

CLINICAL DATA: Esophageal dysphagia. Feels like liquids and solids
gets stuck

EXAM:
ESOPHOGRAM / BARIUM SWALLOW / BARIUM TABLET STUDY
TECHNIQUE: Combined double contrast and single contrast examination performed
using effervescent crystals, thick barium liquid, and thin barium
liquid. The patient was observed with fluoroscopy swallowing a 13 mm
barium sulphate tablet.
FLUOROSCOPY TIME:  Fluoroscopy Time:  42 seconds
Radiation Exposure Index (if provided by the fluoroscopic device):
5.4 mGy
Number of Acquired Spot Images: 0

[Series 1: cp_standard · 0.25mm/px · 2 of 24 frames shown (1 of 8)]
[frame 4/24]
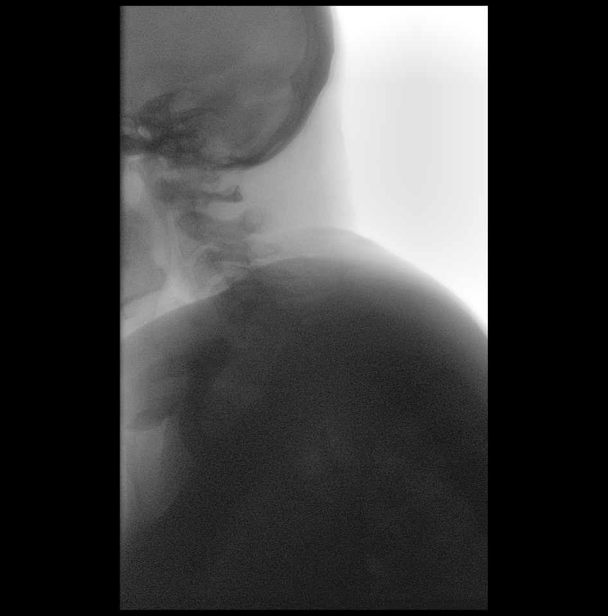
[frame 14/24]
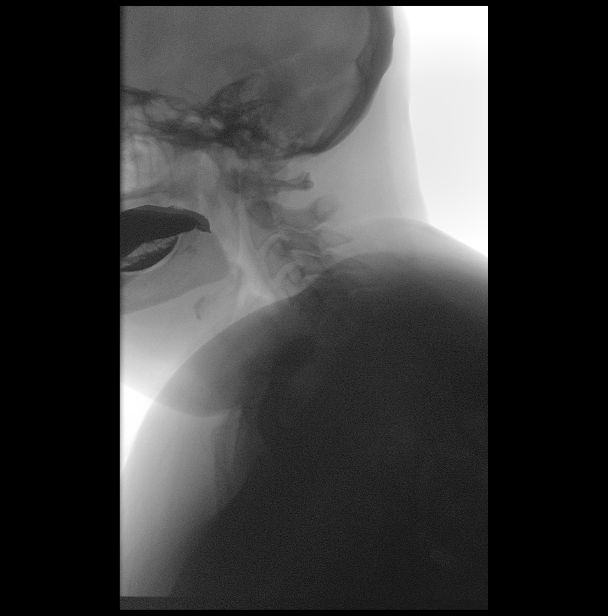

[Series 2: cp_standard · 0.26mm/px · 3 of 37 frames shown (2 of 8)]
[frame 6/37]
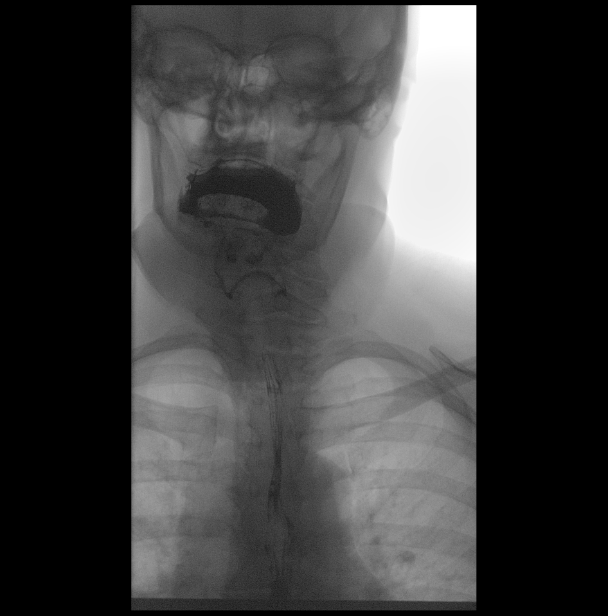
[frame 26/37]
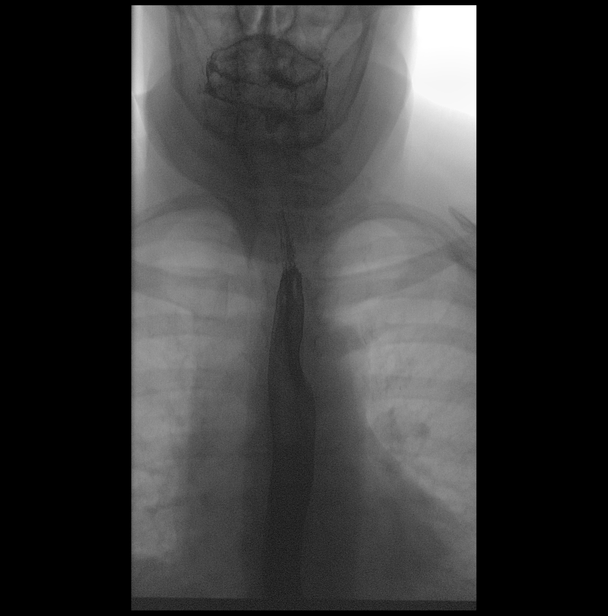
[frame 32/37]
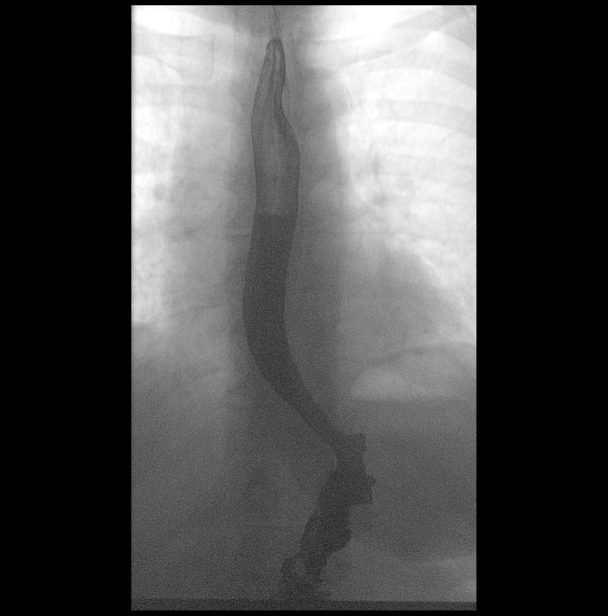

[Series 3: cp_standard · 0.25mm/px · 2 of 39 frames shown (3 of 8)]
[frame 20/39]
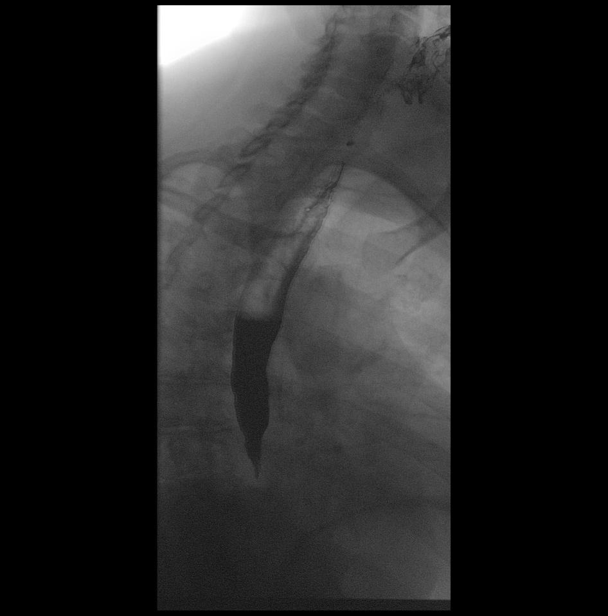
[frame 34/39]
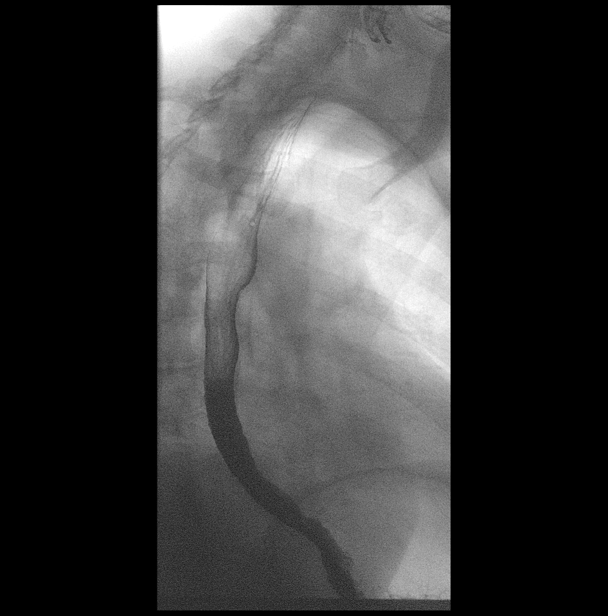

[Series 4: cp_standard · 0.25mm/px · 1 of 1 slices shown (4 of 8)]
[im 1/1]
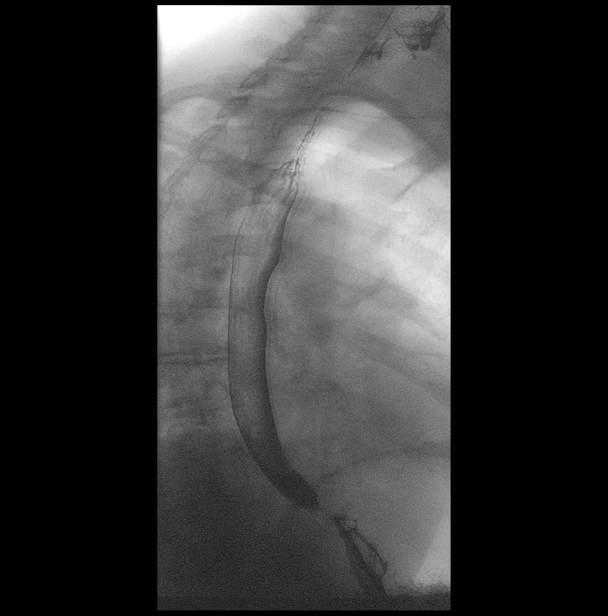

[Series 5: cp_standard · 0.25mm/px · 2 acquisitions, 1 frame shown (5 of 8)]
[im 1/2]
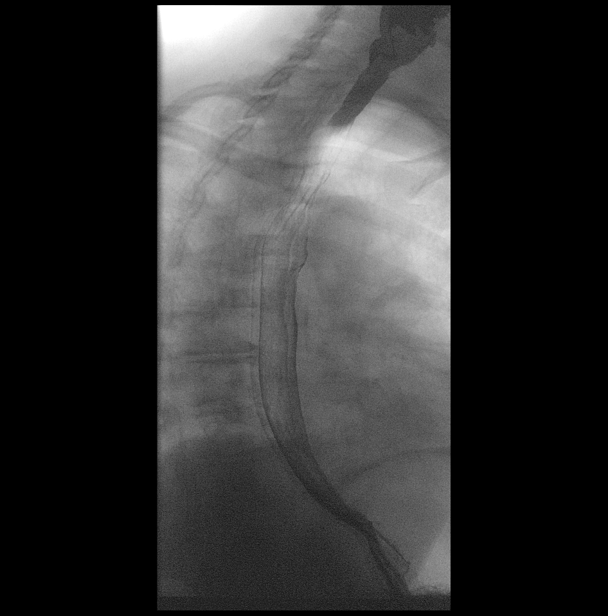

[Series 7: cp_standard · 0.25mm/px · 3 of 25 frames shown (6 of 8)]
[frame 4/25]
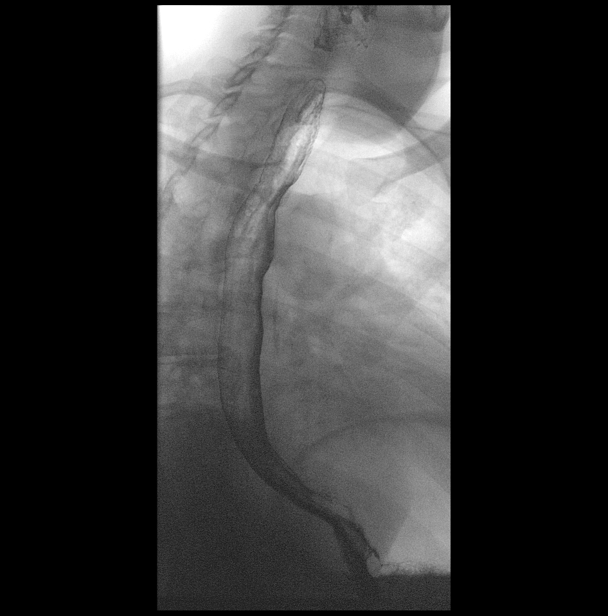
[frame 22/25]
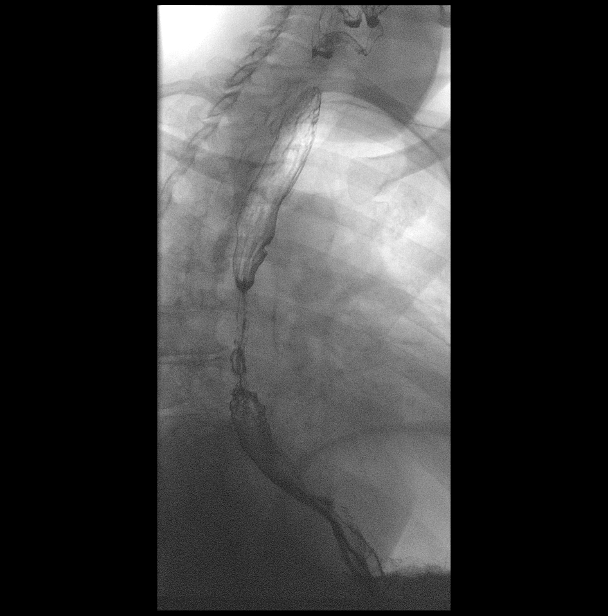
[frame 25/25]
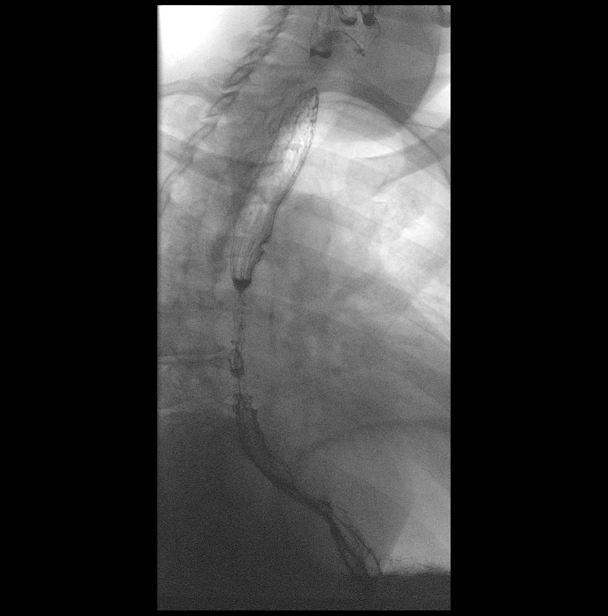

[Series 9: cp_standard · 0.28mm/px · 1 of 1 slices shown (7 of 8)]
[im 1/1]
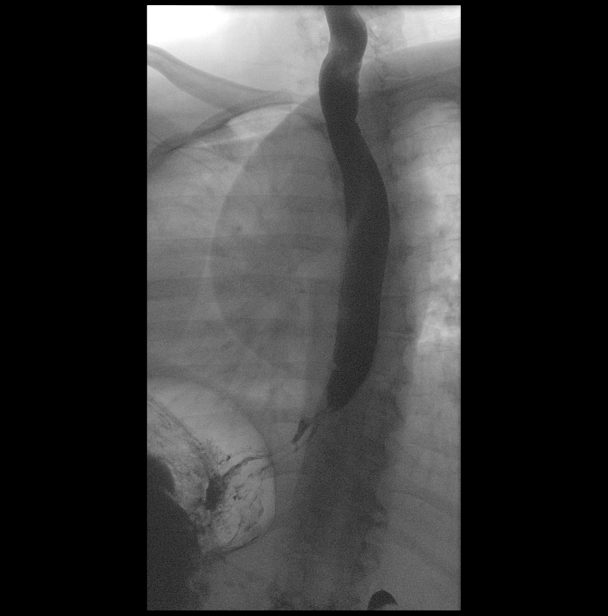

[Series 11: cp_standard · 0.28mm/px · 1 of 1 slices shown (8 of 8)]
[im 1/1]
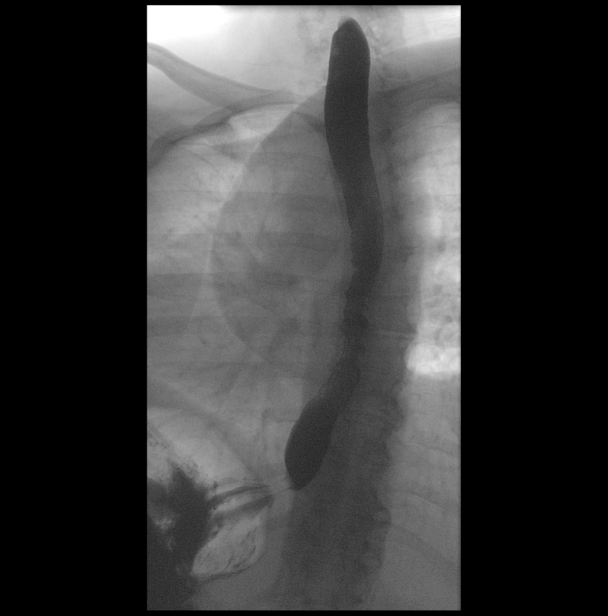

[14 of 24 positions shown; findings below may reference images not displayed]

FINDINGS: Normal pharyngeal anatomy and motility. Contrast flowed freely
through the esophagus without evidence of a stricture or mass.
Normal esophageal mucosa without evidence of irregularity or
ulceration. Esophageal motility was normal. Mild gastroesophageal
reflux. No definite hiatal hernia was demonstrated.

At the end of the examination a 13 mm barium tablet was administered
which transited through the esophagus and esophagogastric junction
without delay.
IMPRESSION: Mild gastroesophageal reflux.

## 2020-07-07 DIAGNOSIS — E1165 Type 2 diabetes mellitus with hyperglycemia: Secondary | ICD-10-CM | POA: Diagnosis not present

## 2020-07-11 ENCOUNTER — Emergency Department
Admission: EM | Admit: 2020-07-11 | Discharge: 2020-07-12 | Disposition: A | Discharge status: Home / Self Care | Payer: Medicare HMO | Source: Home / Self Care | Attending: Emergency Medicine | Admitting: Emergency Medicine

## 2020-07-11 ENCOUNTER — Other Ambulatory Visit: Payer: Self-pay

## 2020-07-11 ENCOUNTER — Emergency Department: Payer: Medicare HMO

## 2020-07-11 ENCOUNTER — Encounter: Payer: Self-pay | Admitting: Emergency Medicine

## 2020-07-11 DIAGNOSIS — E1121 Type 2 diabetes mellitus with diabetic nephropathy: Secondary | ICD-10-CM | POA: Diagnosis not present

## 2020-07-11 DIAGNOSIS — E1122 Type 2 diabetes mellitus with diabetic chronic kidney disease: Secondary | ICD-10-CM | POA: Diagnosis present

## 2020-07-11 DIAGNOSIS — N3090 Cystitis, unspecified without hematuria: Secondary | ICD-10-CM | POA: Diagnosis not present

## 2020-07-11 DIAGNOSIS — S069X9A Unspecified intracranial injury with loss of consciousness of unspecified duration, initial encounter: Secondary | ICD-10-CM | POA: Diagnosis not present

## 2020-07-11 DIAGNOSIS — R4020 Unspecified coma: Secondary | ICD-10-CM | POA: Diagnosis present

## 2020-07-11 DIAGNOSIS — N3001 Acute cystitis with hematuria: Secondary | ICD-10-CM

## 2020-07-11 DIAGNOSIS — E1165 Type 2 diabetes mellitus with hyperglycemia: Secondary | ICD-10-CM | POA: Diagnosis not present

## 2020-07-11 DIAGNOSIS — R410 Disorientation, unspecified: Secondary | ICD-10-CM | POA: Diagnosis not present

## 2020-07-11 DIAGNOSIS — E785 Hyperlipidemia, unspecified: Secondary | ICD-10-CM | POA: Diagnosis present

## 2020-07-11 DIAGNOSIS — I129 Hypertensive chronic kidney disease with stage 1 through stage 4 chronic kidney disease, or unspecified chronic kidney disease: Secondary | ICD-10-CM | POA: Diagnosis present

## 2020-07-11 DIAGNOSIS — U071 COVID-19: Secondary | ICD-10-CM | POA: Diagnosis present

## 2020-07-11 DIAGNOSIS — R531 Weakness: Secondary | ICD-10-CM | POA: Diagnosis not present

## 2020-07-11 DIAGNOSIS — R112 Nausea with vomiting, unspecified: Secondary | ICD-10-CM

## 2020-07-11 DIAGNOSIS — E1142 Type 2 diabetes mellitus with diabetic polyneuropathy: Secondary | ICD-10-CM | POA: Diagnosis present

## 2020-07-11 DIAGNOSIS — E669 Obesity, unspecified: Secondary | ICD-10-CM | POA: Diagnosis present

## 2020-07-11 DIAGNOSIS — N1832 Chronic kidney disease, stage 3b: Secondary | ICD-10-CM | POA: Diagnosis present

## 2020-07-11 DIAGNOSIS — Z79899 Other long term (current) drug therapy: Secondary | ICD-10-CM | POA: Diagnosis not present

## 2020-07-11 DIAGNOSIS — J1282 Pneumonia due to coronavirus disease 2019: Secondary | ICD-10-CM | POA: Diagnosis present

## 2020-07-11 DIAGNOSIS — R110 Nausea: Secondary | ICD-10-CM | POA: Diagnosis not present

## 2020-07-11 DIAGNOSIS — Z807 Family history of other malignant neoplasms of lymphoid, hematopoietic and related tissues: Secondary | ICD-10-CM | POA: Diagnosis not present

## 2020-07-11 DIAGNOSIS — R4182 Altered mental status, unspecified: Secondary | ICD-10-CM | POA: Diagnosis not present

## 2020-07-11 DIAGNOSIS — J9811 Atelectasis: Secondary | ICD-10-CM | POA: Diagnosis not present

## 2020-07-11 DIAGNOSIS — S199XXA Unspecified injury of neck, initial encounter: Secondary | ICD-10-CM | POA: Diagnosis not present

## 2020-07-11 DIAGNOSIS — Z833 Family history of diabetes mellitus: Secondary | ICD-10-CM | POA: Diagnosis not present

## 2020-07-11 DIAGNOSIS — K219 Gastro-esophageal reflux disease without esophagitis: Secondary | ICD-10-CM | POA: Diagnosis present

## 2020-07-11 DIAGNOSIS — I10 Essential (primary) hypertension: Secondary | ICD-10-CM | POA: Insufficient documentation

## 2020-07-11 DIAGNOSIS — Z888 Allergy status to other drugs, medicaments and biological substances status: Secondary | ICD-10-CM | POA: Diagnosis not present

## 2020-07-11 DIAGNOSIS — Z794 Long term (current) use of insulin: Secondary | ICD-10-CM | POA: Diagnosis not present

## 2020-07-11 DIAGNOSIS — I700 Atherosclerosis of aorta: Secondary | ICD-10-CM | POA: Diagnosis not present

## 2020-07-11 DIAGNOSIS — Z6836 Body mass index (BMI) 36.0-36.9, adult: Secondary | ICD-10-CM | POA: Diagnosis not present

## 2020-07-11 DIAGNOSIS — E119 Type 2 diabetes mellitus without complications: Secondary | ICD-10-CM | POA: Insufficient documentation

## 2020-07-11 DIAGNOSIS — R5381 Other malaise: Secondary | ICD-10-CM | POA: Diagnosis present

## 2020-07-11 DIAGNOSIS — Z8673 Personal history of transient ischemic attack (TIA), and cerebral infarction without residual deficits: Secondary | ICD-10-CM | POA: Diagnosis not present

## 2020-07-11 DIAGNOSIS — R778 Other specified abnormalities of plasma proteins: Secondary | ICD-10-CM | POA: Diagnosis present

## 2020-07-11 DIAGNOSIS — Z87891 Personal history of nicotine dependence: Secondary | ICD-10-CM | POA: Diagnosis not present

## 2020-07-11 DIAGNOSIS — D7389 Other diseases of spleen: Secondary | ICD-10-CM | POA: Diagnosis not present

## 2020-07-11 DIAGNOSIS — S0990XA Unspecified injury of head, initial encounter: Secondary | ICD-10-CM | POA: Diagnosis not present

## 2020-07-11 DIAGNOSIS — R55 Syncope and collapse: Secondary | ICD-10-CM | POA: Diagnosis present

## 2020-07-11 DIAGNOSIS — Z90710 Acquired absence of both cervix and uterus: Secondary | ICD-10-CM | POA: Diagnosis not present

## 2020-07-11 DIAGNOSIS — E860 Dehydration: Secondary | ICD-10-CM | POA: Diagnosis present

## 2020-07-11 DIAGNOSIS — J3489 Other specified disorders of nose and nasal sinuses: Secondary | ICD-10-CM | POA: Diagnosis not present

## 2020-07-11 DIAGNOSIS — R404 Transient alteration of awareness: Secondary | ICD-10-CM | POA: Diagnosis not present

## 2020-07-11 DIAGNOSIS — I499 Cardiac arrhythmia, unspecified: Secondary | ICD-10-CM | POA: Diagnosis not present

## 2020-07-11 DIAGNOSIS — N182 Chronic kidney disease, stage 2 (mild): Secondary | ICD-10-CM | POA: Diagnosis not present

## 2020-07-11 DIAGNOSIS — W19XXXA Unspecified fall, initial encounter: Secondary | ICD-10-CM | POA: Diagnosis not present

## 2020-07-11 DIAGNOSIS — J9601 Acute respiratory failure with hypoxia: Secondary | ICD-10-CM | POA: Diagnosis present

## 2020-07-11 DIAGNOSIS — N3000 Acute cystitis without hematuria: Secondary | ICD-10-CM | POA: Diagnosis not present

## 2020-07-11 DIAGNOSIS — Z23 Encounter for immunization: Secondary | ICD-10-CM | POA: Diagnosis not present

## 2020-07-11 LAB — URINALYSIS, COMPLETE (UACMP) WITH MICROSCOPIC
Bilirubin Urine: NEGATIVE
Glucose, UA: 50 mg/dL — AB
Ketones, ur: NEGATIVE mg/dL
Nitrite: NEGATIVE
Protein, ur: >=300 mg/dL — AB
Specific Gravity, Urine: 1.023 (ref 1.005–1.030)
WBC, UA: >50 WBC/hpf — ABNORMAL HIGH (ref 0–5)
pH: 5 (ref 5.0–8.0)

## 2020-07-11 LAB — CBC
HCT: 38.1 % (ref 36.0–46.0)
Hemoglobin: 12.4 g/dL (ref 12.0–15.0)
MCH: 28.4 pg (ref 26.0–34.0)
MCHC: 32.5 g/dL (ref 30.0–36.0)
MCV: 87.2 fL (ref 80.0–100.0)
Platelets: 189 10*3/uL (ref 150–400)
RBC: 4.37 MIL/uL (ref 3.87–5.11)
RDW: 12.9 % (ref 11.5–15.5)
WBC: 15.4 10*3/uL — ABNORMAL HIGH (ref 4.0–10.5)
nRBC: 0 % (ref 0.0–0.2)

## 2020-07-11 LAB — BASIC METABOLIC PANEL
Anion gap: 8 (ref 5–15)
BUN: 20 mg/dL (ref 8–23)
CO2: 27 mmol/L (ref 22–32)
Calcium: 8.8 mg/dL — ABNORMAL LOW (ref 8.9–10.3)
Chloride: 101 mmol/L (ref 98–111)
Creatinine, Ser: 1.22 mg/dL — ABNORMAL HIGH (ref 0.44–1.00)
GFR, Estimated: 49 mL/min — ABNORMAL LOW (ref >60)
Glucose, Bld: 225 mg/dL — ABNORMAL HIGH (ref 70–99)
Potassium: 3.8 mmol/L (ref 3.5–5.1)
Sodium: 136 mmol/L (ref 135–145)

## 2020-07-11 MED ORDER — KETOROLAC TROMETHAMINE 30 MG/ML IJ SOLN
30.0000 mg | Freq: Once | INTRAMUSCULAR | Status: AC
  Administered 2020-07-11: 30 mg via INTRAVENOUS
  Filled 2020-07-11: qty 1

## 2020-07-11 MED ORDER — ONDANSETRON HCL 4 MG/2ML IJ SOLN
4.0000 mg | Freq: Once | INTRAMUSCULAR | Status: AC
  Administered 2020-07-11: 4 mg via INTRAVENOUS
  Filled 2020-07-11: qty 2

## 2020-07-11 MED ORDER — ONDANSETRON 4 MG PO TBDP: 4.0000 mg | ORAL_TABLET | Freq: Three times a day (TID) | ORAL | 0 refills | Status: AC | PRN

## 2020-07-11 MED ORDER — SODIUM CHLORIDE 0.9 % IV SOLN
1.0000 g | Freq: Once | INTRAVENOUS | Status: AC
  Administered 2020-07-11: 1 g via INTRAVENOUS
  Filled 2020-07-11: qty 10

## 2020-07-11 MED ORDER — CEFDINIR 300 MG PO CAPS: 300.0000 mg | ORAL_CAPSULE | Freq: Two times a day (BID) | ORAL | 0 refills | Status: DC

## 2020-07-11 MED ORDER — SODIUM CHLORIDE 0.9 % IV BOLUS
1000.0000 mL | Freq: Once | INTRAVENOUS | Status: AC
  Administered 2020-07-11: 1000 mL via INTRAVENOUS

## 2020-07-11 NOTE — ED Triage Notes (Signed)
PT to ER with c/o left flank pain that started several days ago and now radiates into lower abdomen.  Pt states she started with n/v this afternoon. Pt also report headache.Pt reports increased urination.

## 2020-07-11 NOTE — ED Notes (Signed)
Patient transported to CT 

## 2020-07-11 NOTE — ED Triage Notes (Signed)
Pt in via EMS from home with c/o NV x's 3 today. Temp 99.5, FSBS 271

## 2020-07-11 NOTE — ED Notes (Signed)
Pt provided with water and graham crackers for PO challenge.

## 2020-07-11 NOTE — ED Provider Notes (Signed)
[facility] Emergency Department Provider Note   ____________________________________________   Event Date/Time   First MD Initiated Contact with Patient 07/11/20 2133     (approximate)  I have reviewed the triage vital signs and the nursing notes.   HISTORY  Chief Complaint Flank Pain    HPI Mariah Forbes is a 64 y.o. female with stated past medical history of hypertension and type 2 diabetes who presents for abdominal and flank pain that been worsening over the past 3 days.  Patient states that her associated headache has been worsening over the day.  Patient also endorses 3 episodes of nausea/vomiting today and has been unable to keep anything down by mouth for the last 12 hours.  Patient also endorses polyuria.  Patient currently denies any vision changes, tinnitus, difficulty speaking, facial droop, sore throat, chest pain, shortness of breath, nausea/vomiting/diarrhea, dysuria, or numbness/paresthesias in any extremity         Past Medical History:  Diagnosis Date  . Diabetes mellitus without complication ([place])   . Hypertension   . Renal disorder     There are no problems to display for this patient.   Past Surgical History:  Procedure Laterality Date  . ABDOMINAL HYSTERECTOMY    . APPENDECTOMY    . BREAST EXCISIONAL BIOPSY Bilateral    2 benign lumps, 1980 and 2015  . BREAST LUMPECTOMY Right     Prior to Admission medications   Medication Sig Start Date End Date Taking? Authorizing Provider  cefdinir (OMNICEF) 300 MG capsule Take 1 capsule (300 mg total) by mouth 2 (two) times daily for 5 days. 07/11/20 07/16/20  Naaman Plummer, MD  ondansetron (ZOFRAN ODT) 4 MG disintegrating tablet Take 1 tablet (4 mg total) by mouth every 8 (eight) hours as needed for nausea or vomiting. 07/11/20   Naaman Plummer, MD    Allergies Ace inhibitors  Family History  Problem Relation Age of Onset  . Breast cancer Neg Hx     Social  History Social History   Tobacco Use  . Smoking status: Former Research scientist (life sciences)  . Smokeless tobacco: Never Used  Substance Use Topics  . Alcohol use: Yes  . Drug use: Yes    Frequency: 1.0 times per week    Types: Marijuana    Review of Systems Constitutional: No fever/chills Eyes: No visual changes. ENT: No sore throat. Cardiovascular: Denies chest pain. Respiratory: Denies shortness of breath. Gastrointestinal: Endorses abdominal pain.  No nausea, no vomiting.  No diarrhea. Genitourinary: Negative for dysuria. Musculoskeletal: Negative for acute arthralgias Skin: Negative for rash. Neurological: Negative for headaches, weakness/numbness/paresthesias in any extremity Psychiatric: Negative for suicidal ideation/homicidal ideation   ____________________________________________   PHYSICAL EXAM:  VITAL SIGNS: ED Triage Vitals  Enc Vitals Group     BP 07/11/20 1849 (!) 179/84     Pulse Rate 07/11/20 1849 81     Resp 07/11/20 1849 18     Temp 07/11/20 1849 99.3 F (37.4 C)     Temp Source 07/11/20 1849 Oral     SpO2 07/11/20 1849 100 %     Weight 07/11/20 1850 198 lb (89.8 kg)     Height 07/11/20 1850 5\' 3"  (1.6 m)     Head Circumference --      Peak Flow --      Pain Score 07/11/20 1850 6     Pain Loc --      Pain Edu? --      Excl. in [place]? --  Constitutional: Alert and oriented. Well appearing and in no acute distress. Eyes: Conjunctivae are normal. PERRL. Head: Atraumatic. Nose: No congestion/rhinnorhea. Mouth/Throat: Mucous membranes are moist. Neck: No stridor Cardiovascular: Grossly normal heart sounds.  Good peripheral circulation. Respiratory: Normal respiratory effort.  No retractions. Gastrointestinal: Soft and nontender. No distention. Musculoskeletal: No obvious deformities Neurologic:  Normal speech and language. No gross focal neurologic deficits are appreciated. Skin:  Skin is warm and dry. No rash noted. Psychiatric: Mood and affect are normal. Speech  and behavior are normal.  ____________________________________________   LABS (all labs ordered are listed, but only abnormal results are displayed)  Labs Reviewed  URINALYSIS, COMPLETE (UACMP) WITH MICROSCOPIC - Abnormal; Notable for the following components:      Result Value   Color, Urine YELLOW (*)    APPearance HAZY (*)    Glucose, UA 50 (*)    Hgb urine dipstick SMALL (*)    Protein, ur >=300 (*)    Leukocytes,Ua SMALL (*)    WBC, UA >50 (*)    Bacteria, UA MANY (*)    All other components within normal limits  BASIC METABOLIC PANEL - Abnormal; Notable for the following components:   Glucose, Bld 225 (*)    Creatinine, Ser 1.22 (*)    Calcium 8.8 (*)    GFR, Estimated 49 (*)    All other components within normal limits  CBC - Abnormal; Notable for the following components:   WBC 15.4 (*)    All other components within normal limits    RADIOLOGY  ED MD interpretation: CT of the abdomen and pelvis with IV contrast shows gas within the urinary bladder likely from a gas-forming bacteria  Official radiology report(s): CT Renal Stone Study  Result Date: 07/11/2020 CLINICAL DATA:  Left-sided flank pain. EXAM: CT ABDOMEN AND PELVIS WITHOUT CONTRAST TECHNIQUE: Multidetector CT imaging of the abdomen and pelvis was performed following the standard protocol without IV contrast. COMPARISON:  None. FINDINGS: Lower chest: There are a few hazy airspace opacities in the left lower lobe. There is a trace right-sided pleural effusion. The heart size is normal. Hepatobiliary: The liver is normal. Cholelithiasis without acute inflammation.There is no biliary ductal dilation. Pancreas: Normal contours without ductal dilatation. No peripancreatic fluid collection. Spleen: There are multiple calcifications throughout the patient's spleen. Adrenals/Urinary Tract: --Adrenal glands: Unremarkable. --Right kidney/ureter: No hydronephrosis or radiopaque kidney stones. --Left kidney/ureter: No  hydronephrosis or radiopaque kidney stones. --Urinary bladder: There is gas within the urinary bladder. Stomach/Bowel: --Stomach/Duodenum: No hiatal hernia or other gastric abnormality. Normal duodenal course and caliber. --Small bowel: Unremarkable. --Colon: There is an above average amount of stool throughout the colon. --Appendix: Not visualized. No right lower quadrant inflammation or free fluid. Vascular/Lymphatic: Atherosclerotic calcification is present within the non-aneurysmal abdominal aorta, without hemodynamically significant stenosis. --No retroperitoneal lymphadenopathy. --No mesenteric lymphadenopathy. --No pelvic or inguinal lymphadenopathy. Reproductive: Status post hysterectomy. No adnexal mass. Other: No ascites or free air. The abdominal wall is normal. Musculoskeletal. No acute displaced fractures. IMPRESSION: 1. No acute abdominopelvic abnormality. 2. There are a few hazy airspace opacities in the left lower lobe, concerning for pneumonia or aspiration. There is a trace right-sided pleural effusion. 3. There is gas within the urinary bladder. Correlate for recent instrumentation. Findings can also be seen in cystitis caused by gas-forming organism. 4. Cholelithiasis without acute inflammation. Aortic Atherosclerosis (ICD10-I70.0). Electronically Signed   By: Constance Holster M.D.   On: 07/11/2020 22:20    ____________________________________________   PROCEDURES  Procedure(s) performed (  including Critical Care):  .1-3 Lead EKG Interpretation Performed by: Naaman Plummer, MD Authorized by: Naaman Plummer, MD     Interpretation: normal     ECG rate:  79   ECG rate assessment: normal     Rhythm: sinus rhythm     Ectopy: none     Conduction: normal       ____________________________________________   INITIAL IMPRESSION / ASSESSMENT AND PLAN / ED COURSE  As part of my medical decision making, I reviewed the following data within the [place] notes reviewed and incorporated, Labs reviewed, Old chart reviewed, Radiograph reviewed and Notes from prior ED visits reviewed and incorporated        Not Pregnant. Unlikely TOA, Ovarian Torsion, PID, gonorrhea/chlamydia. Low suspicion for Infected Urolithiasis, AAA, Cholecystitis, Pancreatitis, SBO, Appendicitis, or other acute abdomen.  Rx: Cefdinir 300 mg BID for 5 days Disposition: Discharge home. SRP discussed. Advise follow up with primary care provider within 24-72 hours.      ____________________________________________   FINAL CLINICAL IMPRESSION(S) / ED DIAGNOSES  Final diagnoses:  Acute cystitis with hematuria  Non-intractable vomiting with nausea, unspecified vomiting type     ED Discharge Orders         Ordered    cefdinir (OMNICEF) 300 MG capsule  2 times daily        07/11/20 2315    ondansetron (ZOFRAN ODT) 4 MG disintegrating tablet  Every 8 hours PRN        07/11/20 2315           Note:  This document was prepared using Dragon voice recognition software and may include unintentional dictation errors.   Naaman Plummer, MD 07/11/20 [phone]

## 2020-07-11 NOTE — ED Notes (Addendum)
Pt states coming in for abdominal pain/flank pain that has since resolved. Pt states headache that she has had all day. Pt laying in bed, NAD noted. Pt on BP and pulse ox monitor.  Pt denies pain or burning with urination.

## 2020-07-12 NOTE — ED Notes (Signed)
Pt received 849mL of her NS bolus. Pt states son is on her way and is okay being discharged without finishing the bolus.

## 2020-07-13 ENCOUNTER — Inpatient Hospital Stay: Payer: Medicare HMO

## 2020-07-13 ENCOUNTER — Emergency Department: Payer: Medicare HMO

## 2020-07-13 ENCOUNTER — Inpatient Hospital Stay
Admission: EM | Admit: 2020-07-13 | Discharge: 2020-07-18 | DRG: 177 | Disposition: A | Discharge status: Home / Self Care | Payer: Medicare HMO | Attending: Internal Medicine | Admitting: Internal Medicine

## 2020-07-13 ENCOUNTER — Other Ambulatory Visit: Payer: Self-pay

## 2020-07-13 DIAGNOSIS — E1122 Type 2 diabetes mellitus with diabetic chronic kidney disease: Secondary | ICD-10-CM | POA: Diagnosis not present

## 2020-07-13 DIAGNOSIS — Z888 Allergy status to other drugs, medicaments and biological substances status: Secondary | ICD-10-CM | POA: Diagnosis not present

## 2020-07-13 DIAGNOSIS — J9811 Atelectasis: Secondary | ICD-10-CM | POA: Diagnosis not present

## 2020-07-13 DIAGNOSIS — Z90710 Acquired absence of both cervix and uterus: Secondary | ICD-10-CM

## 2020-07-13 DIAGNOSIS — N1832 Chronic kidney disease, stage 3b: Secondary | ICD-10-CM | POA: Diagnosis present

## 2020-07-13 DIAGNOSIS — Z833 Family history of diabetes mellitus: Secondary | ICD-10-CM | POA: Diagnosis not present

## 2020-07-13 DIAGNOSIS — E119 Type 2 diabetes mellitus without complications: Secondary | ICD-10-CM

## 2020-07-13 DIAGNOSIS — E1142 Type 2 diabetes mellitus with diabetic polyneuropathy: Secondary | ICD-10-CM | POA: Diagnosis present

## 2020-07-13 DIAGNOSIS — K219 Gastro-esophageal reflux disease without esophagitis: Secondary | ICD-10-CM | POA: Diagnosis present

## 2020-07-13 DIAGNOSIS — Z79899 Other long term (current) drug therapy: Secondary | ICD-10-CM | POA: Diagnosis not present

## 2020-07-13 DIAGNOSIS — I129 Hypertensive chronic kidney disease with stage 1 through stage 4 chronic kidney disease, or unspecified chronic kidney disease: Secondary | ICD-10-CM | POA: Diagnosis present

## 2020-07-13 DIAGNOSIS — R404 Transient alteration of awareness: Secondary | ICD-10-CM | POA: Diagnosis not present

## 2020-07-13 DIAGNOSIS — I499 Cardiac arrhythmia, unspecified: Secondary | ICD-10-CM | POA: Diagnosis not present

## 2020-07-13 DIAGNOSIS — E669 Obesity, unspecified: Secondary | ICD-10-CM | POA: Diagnosis present

## 2020-07-13 DIAGNOSIS — N3090 Cystitis, unspecified without hematuria: Secondary | ICD-10-CM | POA: Diagnosis not present

## 2020-07-13 DIAGNOSIS — J9601 Acute respiratory failure with hypoxia: Secondary | ICD-10-CM | POA: Diagnosis present

## 2020-07-13 DIAGNOSIS — E1121 Type 2 diabetes mellitus with diabetic nephropathy: Secondary | ICD-10-CM | POA: Diagnosis present

## 2020-07-13 DIAGNOSIS — J3489 Other specified disorders of nose and nasal sinuses: Secondary | ICD-10-CM | POA: Diagnosis not present

## 2020-07-13 DIAGNOSIS — R778 Other specified abnormalities of plasma proteins: Secondary | ICD-10-CM | POA: Diagnosis present

## 2020-07-13 DIAGNOSIS — N182 Chronic kidney disease, stage 2 (mild): Secondary | ICD-10-CM | POA: Diagnosis not present

## 2020-07-13 DIAGNOSIS — U071 COVID-19: Secondary | ICD-10-CM | POA: Diagnosis present

## 2020-07-13 DIAGNOSIS — Z6836 Body mass index (BMI) 36.0-36.9, adult: Secondary | ICD-10-CM

## 2020-07-13 DIAGNOSIS — S0990XA Unspecified injury of head, initial encounter: Secondary | ICD-10-CM | POA: Diagnosis not present

## 2020-07-13 DIAGNOSIS — Z794 Long term (current) use of insulin: Secondary | ICD-10-CM | POA: Diagnosis not present

## 2020-07-13 DIAGNOSIS — Z87891 Personal history of nicotine dependence: Secondary | ICD-10-CM | POA: Diagnosis not present

## 2020-07-13 DIAGNOSIS — R5381 Other malaise: Secondary | ICD-10-CM | POA: Diagnosis present

## 2020-07-13 DIAGNOSIS — E860 Dehydration: Secondary | ICD-10-CM | POA: Diagnosis present

## 2020-07-13 DIAGNOSIS — J1282 Pneumonia due to coronavirus disease 2019: Secondary | ICD-10-CM | POA: Diagnosis present

## 2020-07-13 DIAGNOSIS — S199XXA Unspecified injury of neck, initial encounter: Secondary | ICD-10-CM | POA: Diagnosis not present

## 2020-07-13 DIAGNOSIS — R410 Disorientation, unspecified: Secondary | ICD-10-CM | POA: Diagnosis not present

## 2020-07-13 DIAGNOSIS — R55 Syncope and collapse: Secondary | ICD-10-CM | POA: Diagnosis not present

## 2020-07-13 DIAGNOSIS — Z807 Family history of other malignant neoplasms of lymphoid, hematopoietic and related tissues: Secondary | ICD-10-CM

## 2020-07-13 DIAGNOSIS — Z8673 Personal history of transient ischemic attack (TIA), and cerebral infarction without residual deficits: Secondary | ICD-10-CM | POA: Diagnosis not present

## 2020-07-13 DIAGNOSIS — Z23 Encounter for immunization: Secondary | ICD-10-CM

## 2020-07-13 DIAGNOSIS — R4182 Altered mental status, unspecified: Secondary | ICD-10-CM | POA: Diagnosis not present

## 2020-07-13 DIAGNOSIS — I10 Essential (primary) hypertension: Secondary | ICD-10-CM | POA: Diagnosis not present

## 2020-07-13 DIAGNOSIS — E785 Hyperlipidemia, unspecified: Secondary | ICD-10-CM | POA: Diagnosis present

## 2020-07-13 DIAGNOSIS — R4020 Unspecified coma: Secondary | ICD-10-CM | POA: Diagnosis present

## 2020-07-13 DIAGNOSIS — N3001 Acute cystitis with hematuria: Secondary | ICD-10-CM | POA: Diagnosis present

## 2020-07-13 DIAGNOSIS — F32A Depression, unspecified: Secondary | ICD-10-CM | POA: Diagnosis present

## 2020-07-13 DIAGNOSIS — S069X9A Unspecified intracranial injury with loss of consciousness of unspecified duration, initial encounter: Secondary | ICD-10-CM | POA: Diagnosis not present

## 2020-07-13 DIAGNOSIS — R531 Weakness: Secondary | ICD-10-CM | POA: Diagnosis not present

## 2020-07-13 DIAGNOSIS — F419 Anxiety disorder, unspecified: Secondary | ICD-10-CM | POA: Diagnosis present

## 2020-07-13 DIAGNOSIS — W19XXXA Unspecified fall, initial encounter: Secondary | ICD-10-CM | POA: Diagnosis not present

## 2020-07-13 HISTORY — DX: Type 2 diabetes mellitus with other diabetic kidney complication: E11.29

## 2020-07-13 HISTORY — DX: Cerebral infarction, unspecified: I63.9

## 2020-07-13 HISTORY — DX: Gastro-esophageal reflux disease without esophagitis: K21.9

## 2020-07-13 HISTORY — DX: Chronic kidney disease, stage 4 (severe): N18.4

## 2020-07-13 HISTORY — DX: Hyperlipidemia, unspecified: E78.5

## 2020-07-13 HISTORY — DX: Migraine, unspecified, not intractable, without status migrainosus: G43.909

## 2020-07-13 LAB — URINALYSIS, ROUTINE W REFLEX MICROSCOPIC
Bacteria, UA: NONE SEEN
Bilirubin Urine: NEGATIVE
Glucose, UA: 150 mg/dL — AB
Ketones, ur: NEGATIVE mg/dL
Leukocytes,Ua: NEGATIVE
Nitrite: NEGATIVE
Protein, ur: 100 mg/dL — AB
Specific Gravity, Urine: 1.02 (ref 1.005–1.030)
pH: 5 (ref 5.0–8.0)

## 2020-07-13 LAB — CBC WITH DIFFERENTIAL/PLATELET
Abs Immature Granulocytes: 0.05 10*3/uL (ref 0.00–0.07)
Basophils Absolute: 0 10*3/uL (ref 0.0–0.1)
Basophils Relative: 0 %
Eosinophils Absolute: 0.1 10*3/uL (ref 0.0–0.5)
Eosinophils Relative: 1 %
HCT: 32 % — ABNORMAL LOW (ref 36.0–46.0)
Hemoglobin: 10.3 g/dL — ABNORMAL LOW (ref 12.0–15.0)
Immature Granulocytes: 1 %
Lymphocytes Relative: 12 %
Lymphs Abs: 0.9 10*3/uL (ref 0.7–4.0)
MCH: 28.5 pg (ref 26.0–34.0)
MCHC: 32.2 g/dL (ref 30.0–36.0)
MCV: 88.4 fL (ref 80.0–100.0)
Monocytes Absolute: 0.6 10*3/uL (ref 0.1–1.0)
Monocytes Relative: 7 %
Neutro Abs: 6.2 10*3/uL (ref 1.7–7.7)
Neutrophils Relative %: 79 %
Platelets: 137 10*3/uL — ABNORMAL LOW (ref 150–400)
RBC: 3.62 MIL/uL — ABNORMAL LOW (ref 3.87–5.11)
RDW: 13.2 % (ref 11.5–15.5)
WBC: 7.9 10*3/uL (ref 4.0–10.5)
nRBC: 0 % (ref 0.0–0.2)

## 2020-07-13 LAB — COMPREHENSIVE METABOLIC PANEL
ALT: 13 U/L (ref 0–44)
AST: 20 U/L (ref 15–41)
Albumin: 2.8 g/dL — ABNORMAL LOW (ref 3.5–5.0)
Alkaline Phosphatase: 62 U/L (ref 38–126)
Anion gap: 11 (ref 5–15)
BUN: 23 mg/dL (ref 8–23)
CO2: 23 mmol/L (ref 22–32)
Calcium: 8.2 mg/dL — ABNORMAL LOW (ref 8.9–10.3)
Chloride: 102 mmol/L (ref 98–111)
Creatinine, Ser: 1.17 mg/dL — ABNORMAL HIGH (ref 0.44–1.00)
GFR, Estimated: 52 mL/min — ABNORMAL LOW (ref >60)
Glucose, Bld: 231 mg/dL — ABNORMAL HIGH (ref 70–99)
Potassium: 3.7 mmol/L (ref 3.5–5.1)
Sodium: 136 mmol/L (ref 135–145)
Total Bilirubin: 0.8 mg/dL (ref 0.3–1.2)
Total Protein: 6.5 g/dL (ref 6.5–8.1)

## 2020-07-13 LAB — CBC
HCT: 35.6 % — ABNORMAL LOW (ref 36.0–46.0)
Hemoglobin: 10.9 g/dL — ABNORMAL LOW (ref 12.0–15.0)
MCH: 28.8 pg (ref 26.0–34.0)
MCHC: 30.6 g/dL (ref 30.0–36.0)
MCV: 93.9 fL (ref 80.0–100.0)
Platelets: 154 10*3/uL (ref 150–400)
RBC: 3.79 MIL/uL — ABNORMAL LOW (ref 3.87–5.11)
RDW: 13.4 % (ref 11.5–15.5)
WBC: 7 10*3/uL (ref 4.0–10.5)
nRBC: 0 % (ref 0.0–0.2)

## 2020-07-13 LAB — HEMOGLOBIN A1C
Hgb A1c MFr Bld: 9.8 % — ABNORMAL HIGH (ref 4.8–5.6)
Mean Plasma Glucose: 234.56 mg/dL

## 2020-07-13 LAB — RESP PANEL BY RT-PCR (FLU A&B, COVID) ARPGX2
Influenza A by PCR: NEGATIVE
Influenza B by PCR: NEGATIVE
SARS Coronavirus 2 by RT PCR: POSITIVE — AB

## 2020-07-13 LAB — CREATININE, SERUM
Creatinine, Ser: 1.22 mg/dL — ABNORMAL HIGH (ref 0.44–1.00)
GFR, Estimated: 49 mL/min — ABNORMAL LOW (ref >60)

## 2020-07-13 LAB — TROPONIN I (HIGH SENSITIVITY)
Troponin I (High Sensitivity): 14 ng/L (ref <18)
Troponin I (High Sensitivity): 13 ng/L (ref <18)
Troponin I (High Sensitivity): 23 ng/L — ABNORMAL HIGH (ref <18)

## 2020-07-13 LAB — CK: Total CK: 285 U/L — ABNORMAL HIGH (ref 38–234)

## 2020-07-13 LAB — PROCALCITONIN: Procalcitonin: <0.1 ng/mL

## 2020-07-13 LAB — TSH: TSH: 1.743 u[IU]/mL (ref 0.350–4.500)

## 2020-07-13 LAB — CBG MONITORING, ED
Glucose-Capillary: 228 mg/dL — ABNORMAL HIGH (ref 70–99)
Glucose-Capillary: 210 mg/dL — ABNORMAL HIGH (ref 70–99)

## 2020-07-13 MED ORDER — FESOTERODINE FUMARATE ER 8 MG PO TB24
8.0000 mg | ORAL_TABLET | Freq: Every day | ORAL | Status: DC
  Administered 2020-07-13 – 2020-07-18 (×6): 8 mg via ORAL
  Filled 2020-07-13 (×7): qty 1

## 2020-07-13 MED ORDER — ENOXAPARIN SODIUM 60 MG/0.6ML ~~LOC~~ SOLN
0.5000 mg/kg | SUBCUTANEOUS | Status: DC
  Administered 2020-07-13 – 2020-07-17 (×5): 45 mg via SUBCUTANEOUS
  Filled 2020-07-13 (×6): qty 0.6

## 2020-07-13 MED ORDER — METHYLPREDNISOLONE SODIUM SUCC 125 MG IJ SOLR: 125.0000 mg | Freq: Once | INTRAMUSCULAR | Status: DC | PRN

## 2020-07-13 MED ORDER — ACETAMINOPHEN 500 MG PO TABS
1000.0000 mg | ORAL_TABLET | Freq: Once | ORAL | Status: AC
  Administered 2020-07-13: 1000 mg via ORAL

## 2020-07-13 MED ORDER — DICYCLOMINE HCL 20 MG PO TABS
20.0000 mg | ORAL_TABLET | Freq: Three times a day (TID) | ORAL | Status: DC | PRN
  Filled 2020-07-13 (×2): qty 1

## 2020-07-13 MED ORDER — ASPIRIN EC 81 MG PO TBEC
81.0000 mg | DELAYED_RELEASE_TABLET | Freq: Every day | ORAL | Status: DC
  Administered 2020-07-13 – 2020-07-18 (×6): 81 mg via ORAL
  Filled 2020-07-13 (×7): qty 1

## 2020-07-13 MED ORDER — PANTOPRAZOLE SODIUM 40 MG PO TBEC
40.0000 mg | DELAYED_RELEASE_TABLET | Freq: Two times a day (BID) | ORAL | Status: DC
  Administered 2020-07-13 – 2020-07-18 (×11): 40 mg via ORAL
  Filled 2020-07-13 (×11): qty 1

## 2020-07-13 MED ORDER — ACETAMINOPHEN 325 MG PO TABS
650.0000 mg | ORAL_TABLET | Freq: Four times a day (QID) | ORAL | Status: DC | PRN
  Administered 2020-07-13: 650 mg via ORAL
  Filled 2020-07-13: qty 2

## 2020-07-13 MED ORDER — INSULIN DETEMIR 100 UNIT/ML ~~LOC~~ SOLN
15.0000 [IU] | Freq: Every morning | SUBCUTANEOUS | Status: DC
  Administered 2020-07-13: 15 [IU] via SUBCUTANEOUS
  Filled 2020-07-13 (×2): qty 0.15

## 2020-07-13 MED ORDER — GABAPENTIN 300 MG PO CAPS
300.0000 mg | ORAL_CAPSULE | Freq: Two times a day (BID) | ORAL | Status: DC
  Administered 2020-07-13 – 2020-07-18 (×11): 300 mg via ORAL
  Filled 2020-07-13 (×11): qty 1

## 2020-07-13 MED ORDER — SENNA 8.6 MG PO TABS
1.0000 | ORAL_TABLET | Freq: Two times a day (BID) | ORAL | Status: DC
  Administered 2020-07-13 – 2020-07-18 (×9): 8.6 mg via ORAL
  Filled 2020-07-13 (×10): qty 1

## 2020-07-13 MED ORDER — SUMATRIPTAN SUCCINATE 50 MG PO TABS
50.0000 mg | ORAL_TABLET | ORAL | Status: DC | PRN
  Filled 2020-07-13: qty 1

## 2020-07-13 MED ORDER — FLUOXETINE HCL 20 MG PO CAPS
20.0000 mg | ORAL_CAPSULE | Freq: Every day | ORAL | Status: DC
  Administered 2020-07-13 – 2020-07-18 (×6): 20 mg via ORAL
  Filled 2020-07-13 (×6): qty 1

## 2020-07-13 MED ORDER — FAMOTIDINE IN NACL 20-0.9 MG/50ML-% IV SOLN
20.0000 mg | Freq: Once | INTRAVENOUS | Status: DC | PRN
  Filled 2020-07-13: qty 50

## 2020-07-13 MED ORDER — ATORVASTATIN CALCIUM 20 MG PO TABS
40.0000 mg | ORAL_TABLET | Freq: Every day | ORAL | Status: DC
  Administered 2020-07-13 – 2020-07-18 (×6): 40 mg via ORAL
  Filled 2020-07-13 (×7): qty 2

## 2020-07-13 MED ORDER — TRAZODONE HCL 50 MG PO TABS
50.0000 mg | ORAL_TABLET | Freq: Every evening | ORAL | Status: DC | PRN
  Administered 2020-07-15 – 2020-07-17 (×3): 50 mg via ORAL
  Filled 2020-07-13 (×3): qty 1

## 2020-07-13 MED ORDER — INSULIN DETEMIR 100 UNIT/ML ~~LOC~~ SOLN
65.0000 [IU] | Freq: Every day | SUBCUTANEOUS | Status: DC
  Administered 2020-07-13: 65 [IU] via SUBCUTANEOUS
  Filled 2020-07-13 (×2): qty 0.65

## 2020-07-13 MED ORDER — EPINEPHRINE 0.3 MG/0.3ML IJ SOAJ
0.3000 mg | Freq: Once | INTRAMUSCULAR | Status: DC | PRN
  Filled 2020-07-13: qty 0.3

## 2020-07-13 MED ORDER — CEFTRIAXONE SODIUM 1 G IJ SOLR
1.0000 g | INTRAMUSCULAR | Status: AC
  Administered 2020-07-13 – 2020-07-15 (×3): 1 g via INTRAVENOUS
  Filled 2020-07-13 (×3): qty 10

## 2020-07-13 MED ORDER — SODIUM CHLORIDE 0.45 % IV SOLN: INTRAVENOUS | Status: DC

## 2020-07-13 MED ORDER — METOCLOPRAMIDE HCL 5 MG PO TABS
5.0000 mg | ORAL_TABLET | Freq: Two times a day (BID) | ORAL | Status: DC
  Administered 2020-07-13 – 2020-07-18 (×11): 5 mg via ORAL
  Filled 2020-07-13 (×11): qty 1

## 2020-07-13 MED ORDER — SODIUM CHLORIDE 0.9 % IV SOLN
Freq: Once | INTRAVENOUS | Status: AC
  Filled 2020-07-13: qty 20

## 2020-07-13 MED ORDER — SODIUM CHLORIDE 0.9 % IV SOLN
INTRAVENOUS | Status: DC | PRN
  Administered 2020-07-18: 250 mL via INTRAVENOUS

## 2020-07-13 MED ORDER — DIPHENHYDRAMINE HCL 50 MG/ML IJ SOLN: 50.0000 mg | Freq: Once | INTRAMUSCULAR | Status: DC | PRN

## 2020-07-13 MED ORDER — ACETAMINOPHEN 650 MG RE SUPP: 650.0000 mg | Freq: Four times a day (QID) | RECTAL | Status: DC | PRN

## 2020-07-13 MED ORDER — ALBUTEROL SULFATE HFA 108 (90 BASE) MCG/ACT IN AERS
2.0000 | INHALATION_SPRAY | Freq: Once | RESPIRATORY_TRACT | Status: DC | PRN
  Filled 2020-07-13: qty 6.7

## 2020-07-13 MED ORDER — METOPROLOL TARTRATE 50 MG PO TABS
50.0000 mg | ORAL_TABLET | Freq: Two times a day (BID) | ORAL | Status: DC
  Administered 2020-07-13 – 2020-07-18 (×10): 50 mg via ORAL
  Filled 2020-07-13 (×10): qty 1

## 2020-07-13 MED ORDER — SODIUM CHLORIDE 0.9 % IV SOLN
Freq: Once | INTRAVENOUS | Status: DC
  Filled 2020-07-13: qty 20

## 2020-07-13 MED ORDER — ENOXAPARIN SODIUM 40 MG/0.4ML ~~LOC~~ SOLN: 40.0000 mg | SUBCUTANEOUS | Status: DC

## 2020-07-13 MED ORDER — LACTATED RINGERS IV BOLUS
1000.0000 mL | Freq: Once | INTRAVENOUS | Status: AC
  Administered 2020-07-13: 1000 mL via INTRAVENOUS

## 2020-07-13 NOTE — Consult Note (Signed)
Cardiology Consultation Note    Patient ID: Mariah Forbes, MRN: [id number], DOB/AGE: 08/24/1955 64 y.o. Admit date: 07/13/2020   Date of Consult: 07/13/2020 Primary Physician: Gladstone Lighter, MD Primary Cardiologist:  None  Chief Complaint: fall Reason for Consultation: syncope Requesting MD: Dr. Devynn Hedges  HPI: Mariah Forbes is a 64 y.o. female with history of diabetes, hypertension, renal disease who was brought to the emergency room after being found down by her family at her place of residence.  She was seen in the ER several days previously with a probable UTI.  Patient does not have any idea why she had fallen.  She had difficulty getting up from her recliner and feels like she fell.  She regained consciousness at 3 AM.  She apparently was on the floor for approximately 12 hours.  She was covered in urine.  She has a history CVA, chronic kidney disease, hyperlipidemia and hypertension.  She had cold-like symptoms earlier in the week.  EKG shows sinus rhythm with no ischemia.  EKG shows a chronic kidney disease with a creatinine of 1.17.  Electrolytes were otherwise normal.  High-sensitivity troponin was normal.  Chest x-ray suggested mild bibasilar interstitial prominence.  No obvious pulmonary congestion.  Brain CT showed no acute abnormality.  Cervical spine was cleared.  Patient states she quit smoking smokes marijuana every week.  Currently uses ethanol.  Denies chest pain.  Medications prior to admission Omnicef for her recent UTI.  Occasional Zofran.  She is Covid positive.  Past Medical History:  Diagnosis Date  . CKD (chronic kidney disease) stage 4, GFR 15-29 ml/min (HCC)   . CVA (cerebral vascular accident) [facility])    reported as 4 strokes since 2009  . DM (diabetes mellitus) type II controlled with renal manifestation ([place])   . GERD (gastroesophageal reflux disease)   . HLD (hyperlipidemia)   . Hypertension   . Migraine       Surgical History:  Past Surgical History:   Procedure Laterality Date  . ABDOMINAL HYSTERECTOMY    . APPENDECTOMY    . BREAST EXCISIONAL BIOPSY Bilateral    2 benign lumps, 1980 and 2015  . BREAST LUMPECTOMY Right      Home Meds: Prior to Admission medications   Medication Sig Start Date End Date Taking? Authorizing Provider  atorvastatin (LIPITOR) 40 MG tablet Take 40 mg by mouth at bedtime. 05/12/20   [provider]  cefdinir (OMNICEF) 300 MG capsule Take 1 capsule (300 mg total) by mouth 2 (two) times daily for 5 days. 07/11/20 07/16/20  Naaman Plummer, MD  famotidine (PEPCID) 20 MG tablet Take 20 mg by mouth 2 (two) times daily. 07/04/20   [provider]  FLUoxetine (PROZAC) 20 MG capsule Take 20 mg by mouth daily. 06/25/20   [provider]  gabapentin (NEURONTIN) 300 MG capsule Take 300 mg by mouth 2 (two) times daily. 06/22/20   [provider]  HUMALOG KWIKPEN 100 UNIT/ML KwikPen Inject 10 Units into the skin 3 (three) times daily. 03/11/20   [provider]  hydrochlorothiazide (HYDRODIURIL) 12.5 MG tablet Take 12.5 mg by mouth daily. 05/12/20   [provider]  JARDIANCE 25 MG TABS tablet Take 25 mg by mouth daily. 06/06/20   [provider]  LEVEMIR FLEXTOUCH 100 UNIT/ML FlexPen Inject 65 Units into the skin daily. 07/11/20   [provider]  losartan (COZAAR) 100 MG tablet Take 100 mg by mouth daily. 05/17/20   [provider]  meclizine (  ANTIVERT) 12.5 MG tablet Take 12.5 mg by mouth 2 (two) times daily. 02/09/20   [provider]  metoprolol tartrate (LOPRESSOR) 50 MG tablet Take 50 mg by mouth 2 (two) times daily. 02/13/20   [provider]  MYRBETRIQ 25 MG TB24 tablet Take 25 mg by mouth daily. 07/11/20   [provider]  NOVOLOG FLEXPEN 100 UNIT/ML FlexPen Inject 15-20 Units into the skin 3 (three) times daily. 06/03/20   [provider]  ondansetron (ZOFRAN ODT) 4 MG disintegrating tablet Take 1 tablet  (4 mg total) by mouth every 8 (eight) hours as needed for nausea or vomiting. 07/11/20   Naaman Plummer, MD  pantoprazole (PROTONIX) 40 MG tablet Take 40 mg by mouth daily. 05/17/20   [provider]  TOVIAZ 8 MG TB24 tablet Take 8 mg by mouth daily. 06/25/20   [provider]  traZODone (DESYREL) 100 MG tablet Take 50 mg by mouth at bedtime. 07/04/20   [provider]  VICTOZA 18 MG/3ML SOPN Inject 0.6-1.8 mg into the skin daily. 03/03/20   [provider]    Inpatient Medications:  . aspirin EC  81 mg Oral Daily  . atorvastatin  40 mg Oral Daily  . enoxaparin (LOVENOX) injection  40 mg Subcutaneous Q24H  . fesoterodine  8 mg Oral Daily  . FLUoxetine  20 mg Oral Daily  . gabapentin  300 mg Oral BID  . insulin detemir  15 Units Subcutaneous q AM  . insulin detemir  65 Units Subcutaneous QHS  . metoCLOPramide  5 mg Oral BID  . metoprolol tartrate  50 mg Oral BID  . pantoprazole  40 mg Oral BID  . senna  1 tablet Oral BID   . sodium chloride    . sodium chloride    . bamlanivimab / etesevimab IVPB    . cefTRIAXone (ROCEPHIN)  IV    . famotidine (PEPCID) IV      Allergies:  Allergies  Allergen Reactions  . Ace Inhibitors Itching and Rash    Other reaction(s): Cough    Social History   Socioeconomic History  . Marital status: Married    Spouse name: Not on file  . Number of children: Not on file  . Years of education: Not on file  . Highest education level: Not on file  Occupational History  . Not on file  Tobacco Use  . Smoking status: Former Research scientist (life sciences)  . Smokeless tobacco: Never Used  Substance and Sexual Activity  . Alcohol use: Yes  . Drug use: Yes    Frequency: 1.0 times per week    Types: Marijuana  . Sexual activity: Not Currently  Other Topics Concern  . Not on file  Social History Narrative  . Not on file   Social Determinants of Health   Financial Resource Strain: Not on file  Food Insecurity: Not on file   Transportation Needs: Not on file  Physical Activity: Not on file  Stress: Not on file  Social Connections: Not on file  Intimate Partner Violence: Not on file     Family History  Problem Relation Age of Onset  . Cerebral aneurysm Mother   . Lymphoma Father   . Diabetes Child   . Breast cancer Neg Hx      Review of Systems: A 12-system review of systems was performed and is negative except as noted in the HPI.  Labs: Recent Labs    07/13/20 0521  CKTOTAL 285*   Lab Results  Component Value Date   WBC 7.9 07/13/2020   HGB 10.3 (L) 07/13/2020   HCT 32.0 (L) 07/13/2020   MCV 88.4 07/13/2020   PLT 137 (L) 07/13/2020    Recent Labs  Lab 07/13/20 0521  NA 136  K 3.7  CL 102  CO2 23  BUN 23  CREATININE 1.17*  CALCIUM 8.2*  PROT 6.5  BILITOT 0.8  ALKPHOS 62  ALT 13  AST 20  GLUCOSE 231*   No results found for: CHOL, HDL, LDLCALC, TRIG No results found for: DDIMER  Radiology/Studies:  DG Chest 2 View  Result Date: 07/13/2020 CLINICAL DATA:  Fall. EXAM: CHEST - 2 VIEW COMPARISON:  10/15/2019. FINDINGS: Mediastinum and hilar structures normal. Heart size normal. No pulmonary venous congestion. Low lung volumes with bibasilar atelectasis. Mild bibasilar interstitial prominence. Bibasilar interstitial edema and or pneumonitis cannot be excluded. No pleural effusion or pneumothorax. IMPRESSION: Low lung volumes with bibasilar atelectasis. Mild bibasilar interstitial prominence. Bibasilar interstitial edema and or pneumonitis cannot be excluded. Electronically Signed   By: Marcello Moores  Register   On: 07/13/2020 06:34   CT Head Wo Contrast  Result Date: 07/13/2020 CLINICAL DATA:  Unwitnessed fall, altered mental status, head injury EXAM: CT HEAD WITHOUT CONTRAST CT CERVICAL SPINE WITHOUT CONTRAST TECHNIQUE: Multidetector CT imaging of the head and cervical spine was performed following the standard protocol without intravenous contrast. Multiplanar CT image reconstructions  of the cervical spine were also generated. COMPARISON:  None. FINDINGS: CT HEAD FINDINGS Brain: Normal anatomic configuration. Parenchymal volume loss is commensurate with the patient's age. Mild periventricular white matter changes are present likely reflecting the sequela of small vessel ischemia. No abnormal intra or extra-axial mass lesion or fluid collection. No abnormal mass effect or midline shift. No evidence of acute intracranial hemorrhage or infarct. Ventricular size is normal. Cerebellum unremarkable. Vascular: No asymmetric hyperdense vasculature at the skull base. Skull: Intact Sinuses/Orbits: Moderate mucosal thickening is noted within the a maxillary sinuses bilaterally and ethmoid air cells bilaterally. No air-fluid levels. The frontal sinuses are hypoplastic. Other: Mastoid air cells and middle ear cavities are clear. CT CERVICAL SPINE FINDINGS Alignment: Normal cervical lordosis.  No listhesis. Skull base and vertebrae: The craniocervical junction is unremarkable. The atlantodental interval is normal. No acute fracture of the cervical spine. Soft tissues and spinal canal: No prevertebral fluid or swelling. No visible canal hematoma. Disc levels: Review of the sagittal images demonstrates preservation of vertebral body height. There is mild intervertebral disc space narrowing and endplate remodeling of a C4-C7 in keeping with changes of mild degenerative disc disease. Spinal canal is widely patent. Prevertebral soft tissues are not thickened. Review of the axial images demonstrates multilevel uncovertebral and facet arthrosis without significant associated neural foraminal narrowing. Upper chest: Unremarkable Other: None IMPRESSION: No acute intracranial abnormality.  No calvarial fracture. Moderate paranasal sinus disease. No acute fracture or listhesis of the cervical spine. Electronically Signed   By: Fidela Salisbury MD   On: 07/13/2020 06:39   CT Cervical Spine Wo Contrast  Result Date:  07/13/2020 CLINICAL DATA:  Unwitnessed fall, altered mental status, head injury EXAM: CT HEAD WITHOUT CONTRAST CT CERVICAL SPINE WITHOUT CONTRAST TECHNIQUE: Multidetector CT imaging of the head and cervical spine was performed following the standard protocol without intravenous contrast. Multiplanar CT image reconstructions of the cervical spine were also generated. COMPARISON:  None. FINDINGS: CT HEAD FINDINGS Brain: Normal anatomic configuration. Parenchymal volume loss is commensurate with the patient's age. Mild periventricular white matter changes are present likely  reflecting the sequela of small vessel ischemia. No abnormal intra or extra-axial mass lesion or fluid collection. No abnormal mass effect or midline shift. No evidence of acute intracranial hemorrhage or infarct. Ventricular size is normal. Cerebellum unremarkable. Vascular: No asymmetric hyperdense vasculature at the skull base. Skull: Intact Sinuses/Orbits: Moderate mucosal thickening is noted within the a maxillary sinuses bilaterally and ethmoid air cells bilaterally. No air-fluid levels. The frontal sinuses are hypoplastic. Other: Mastoid air cells and middle ear cavities are clear. CT CERVICAL SPINE FINDINGS Alignment: Normal cervical lordosis.  No listhesis. Skull base and vertebrae: The craniocervical junction is unremarkable. The atlantodental interval is normal. No acute fracture of the cervical spine. Soft tissues and spinal canal: No prevertebral fluid or swelling. No visible canal hematoma. Disc levels: Review of the sagittal images demonstrates preservation of vertebral body height. There is mild intervertebral disc space narrowing and endplate remodeling of a C4-C7 in keeping with changes of mild degenerative disc disease. Spinal canal is widely patent. Prevertebral soft tissues are not thickened. Review of the axial images demonstrates multilevel uncovertebral and facet arthrosis without significant associated neural foraminal  narrowing. Upper chest: Unremarkable Other: None IMPRESSION: No acute intracranial abnormality.  No calvarial fracture. Moderate paranasal sinus disease. No acute fracture or listhesis of the cervical spine. Electronically Signed   By: Fidela Salisbury MD   On: 07/13/2020 06:39   CT Renal Stone Study  Result Date: 07/11/2020 CLINICAL DATA:  Left-sided flank pain. EXAM: CT ABDOMEN AND PELVIS WITHOUT CONTRAST TECHNIQUE: Multidetector CT imaging of the abdomen and pelvis was performed following the standard protocol without IV contrast. COMPARISON:  None. FINDINGS: Lower chest: There are a few hazy airspace opacities in the left lower lobe. There is a trace right-sided pleural effusion. The heart size is normal. Hepatobiliary: The liver is normal. Cholelithiasis without acute inflammation.There is no biliary ductal dilation. Pancreas: Normal contours without ductal dilatation. No peripancreatic fluid collection. Spleen: There are multiple calcifications throughout the patient's spleen. Adrenals/Urinary Tract: --Adrenal glands: Unremarkable. --Right kidney/ureter: No hydronephrosis or radiopaque kidney stones. --Left kidney/ureter: No hydronephrosis or radiopaque kidney stones. --Urinary bladder: There is gas within the urinary bladder. Stomach/Bowel: --Stomach/Duodenum: No hiatal hernia or other gastric abnormality. Normal duodenal course and caliber. --Small bowel: Unremarkable. --Colon: There is an above average amount of stool throughout the colon. --Appendix: Not visualized. No right lower quadrant inflammation or free fluid. Vascular/Lymphatic: Atherosclerotic calcification is present within the non-aneurysmal abdominal aorta, without hemodynamically significant stenosis. --No retroperitoneal lymphadenopathy. --No mesenteric lymphadenopathy. --No pelvic or inguinal lymphadenopathy. Reproductive: Status post hysterectomy. No adnexal mass. Other: No ascites or free air. The abdominal wall is normal.  Musculoskeletal. No acute displaced fractures. IMPRESSION: 1. No acute abdominopelvic abnormality. 2. There are a few hazy airspace opacities in the left lower lobe, concerning for pneumonia or aspiration. There is a trace right-sided pleural effusion. 3. There is gas within the urinary bladder. Correlate for recent instrumentation. Findings can also be seen in cystitis caused by gas-forming organism. 4. Cholelithiasis without acute inflammation. Aortic Atherosclerosis (ICD10-I70.0). Electronically Signed   By: Constance Holster M.D.   On: 07/11/2020 22:20    Wt Readings from Last 3 Encounters:  07/11/20 89.8 kg  05/04/20 90.7 kg  10/15/19 90.7 kg    EKG: Normal sinus rhythm with no ischemia  Physical Exam: Female no acute distress. Blood pressure (!) 144/66, pulse 78, temperature 99.6 F (37.6 C), temperature source Oral, resp. rate (!) 21, SpO2 100 %. There is no height or weight on  file to calculate BMI. General: Well developed, well nourished, in no acute distress. Head: Normocephalic, atraumatic, sclera non-icteric, no xanthomas, nares are without discharge.  Neck: Negative for carotid bruits. JVD not elevated. Lungs: Clear bilaterally to auscultation without wheezes, rales, or rhonchi. Breathing is unlabored.  Mild shortness of breath. Heart: RRR with S1 S2. No murmurs, rubs, or gallops appreciated. Abdomen: Soft, non-tender, non-distended with normoactive bowel sounds. No hepatomegaly. No rebound/guarding. No obvious abdominal masses. Msk:  Strength and tone appear normal for age. Extremities: No clubbing or cyanosis. No edema.  Distal pedal pulses are 2+ and equal bilaterally.  Myalgias. Neuro: Alert and oriented X 3. No facial asymmetry. No focal deficit. Moves all extremities spontaneously. Psych:  Responds to questions appropriately with a normal affect.     Assessment and Plan  64 y.o. female with history of diabetes, hypertension, renal disease who was brought to the  emergency room after being found down by her family at her place of residence.  She was seen in the ER several days previously with a probable UTI.  Patient does not have any idea why she had fallen.  She had difficulty getting up from her recliner and feels like she fell.  She regained consciousness at 3 AM.  She apparently was on the floor for approximately 12 hours.  She was covered in urine.  She has a history CVA, chronic kidney disease, hyperlipidemia and hypertension.  She had cold-like symptoms earlier in the week.  EKG shows sinus rhythm with no ischemia.  EKG shows a chronic kidney disease with a creatinine of 1.17.  Electrolytes were otherwise normal.  High-sensitivity troponin was normal.  Chest x-ray suggested mild bibasilar interstitial prominence.  No obvious pulmonary congestion.  Brain CT showed no acute abnormality.  Cervical spine was cleared.  Patient states she quit smoking smokes marijuana every week.  Currently uses ethanol.  Denies chest pain.  Medications prior to admission Omnicef for her recent UTI.  Occasional Zofran.  She is Covid positive.  1.  Loss of consciousness-etiology is unclear.  Patient recalls getting up from a chair and felt like she may have fallen.  It is unclear whether she had syncope or not.  She denied any lightheadedness or dizziness per her report.  She has had chest discomfort and palpitations in the past.  She denies any recent chest pain.  There have been no arrhythmias on her monitor thus far.  Her renal function appears stable.  Her electrolytes are normal.  She does not appear to be in pulmonary edema.  We will continue to follow on telemetry for evidence of arrhythmia.  Will review echocardiogram when available and consider functional study in the a.m. to further evaluate for possible ischemic etiology.  In the meantime would continue with current regimen as she appears to have ruled out for myocardial infarction thus far.  2.  History of CVA-no evidence of  progression of her neurologic deficits.  No acute abnormality on head CT.  Patient was incontinent of urine however unclear whether she had seizure activity or not.  No obvious seizure activity or postictal state as present.  3.  Diabetes mellitus-continue with current regimen following glucose.  4.  Chronic kidney disease-appears to be near her baseline.  Signed, Teodoro Spray MD 07/13/2020, 10:13 AM Pager: [phone]

## 2020-07-13 NOTE — ED Notes (Signed)
Patient transported to MRI 

## 2020-07-13 NOTE — ED Notes (Signed)
Pt sitting up in bed eating lunch.  Voices no needs at this time.

## 2020-07-13 NOTE — Progress Notes (Signed)
eeg done °

## 2020-07-13 NOTE — Procedures (Signed)
Patient Name: Mariah Forbes  MRN: [id number]  Epilepsy Attending: Lora Havens  Referring Physician/Provider: Dr Jeneen Rinks Date: 07/13/2020 Duration: 21.23 mins  Patient history: 64yo F with prior CVA, now with LOC. EEG to evaluate for seizure  Level of alertness: Awake, asleep  AEDs during EEG study: GBP  Technical aspects: This EEG study was done with scalp electrodes positioned according to the 10-20 International system of electrode placement. Electrical activity was acquired at a sampling rate of 500Hz  and reviewed with a high frequency filter of 70Hz  and a low frequency filter of 1Hz . EEG data were recorded continuously and digitally stored.   Description: The posterior dominant rhythm consists of 8-9 Hz activity of moderate voltage (25-35 uV) seen predominantly in posterior head regions, symmetric and reactive to eye opening and eye closing. Sleep was characterized by vertex waves, sleep spindles (12 to 14 Hz), maximal frontocentral region.   Hyperventilation did not show any EEG change. Physiologic photic driving was not seen during photic stimulation.  Hyperventilation was not performed.     IMPRESSION: This study is within normal limits. No seizures or epileptiform discharges were seen throughout the recording.  Oluwaferanmi Wain Barbra Sarks

## 2020-07-13 NOTE — H&P (Signed)
History and Physical    Mariah Forbes WYO:[id number] DOB: 02/25/1956 DOA: 07/13/2020  PCP: Gladstone Lighter, MD (Confirm with patient/family/NH records and if not entered, this has to be entered at [facility] point of entry) Patient coming from: home  I have personally briefly reviewed patient's old medical records in [place]  Chief Complaint: LOC  HPI: Mariah Forbes is a 64 y.o. female with medical history significant of  diabetes, hypertension, kidney disease who presents for possible fall versus syncope.  Patient was seen in the emergency room 2 days ago for abdominal pain and flank pain and was diagnosed with a UTI.  She was discharged home with a prescription that she hasn't started yet.  Last thing the patient remembers was trying to get up from her recliner yesterday in the afternoon.  Her son last saw patient around 61 noon/1 PM.  Patient reports having difficulty getting up from her recliner and she thinks that she might have lost her balance and fell.  Patient regained consciousness at 3 AM.  She thought it was actually 3 PM and called her son who was concerned and came to check on her.  Patient has been on the floor for over 12 hours.  She is covered in urine.  No tongue trauma.  She is complaining of diffuse body pain, headache, neck pain.  She denies any prior history of seizure with syncope.  She denies any tongue trauma.  Patient reports a cough for about a week.  Her son was sick with cold-like symptoms last week.  She denies of any body aches until waking up in the floor this evening.  She denies sore throat or any known fevers.   On questioning the patient does report a several week h/o progressive decreased exercise tolerance along with mild left chest pain and SOB. She has multiple cardiac risk factors including difficult to control diabetes, HLD, obesity, HTN.  ED Course: T98.7  150/72  HR 72  RR 18. ED-P exam unremarkable. Lab - Covid POSITIVE - patient has been  vaccinated, Cmet - glucose 205, Trop 14  CBC/Diff nl, CK total 285. CT head/neck negative for acute findings. CXR bibasilar atelectasis - cannot r/o edema, inflammation. In ED patient bolused w/ 1 liter IVF, given APAP 1,000 mg. TRH called to admit for LOC, possible Seizure  Review of Systems: As per HPI otherwise 10 point review of systems negative.    Past Medical History:  Diagnosis Date  . CKD (chronic kidney disease) stage 4, GFR 15-29 ml/min (HCC)   . CVA (cerebral vascular accident) [facility])    reported as 4 strokes since 2009  . DM (diabetes mellitus) type II controlled with renal manifestation ([place])   . GERD (gastroesophageal reflux disease)   . HLD (hyperlipidemia)   . Hypertension   . Migraine     Past Surgical History:  Procedure Laterality Date  . ABDOMINAL HYSTERECTOMY    . APPENDECTOMY    . BREAST EXCISIONAL BIOPSY Bilateral    2 benign lumps, 1980 and 2015  . BREAST LUMPECTOMY Right     Soc Hx - married 50 years but seperated the last 15 years. She has 3 sons, 5 grandchildren. She lives alone but one son is 8 minutes away. She is retired from [employer] work.    reports that she has quit smoking. She has never used smokeless tobacco. She reports current alcohol use. She reports current drug use. Frequency: 1.00 time per week. Drug: Marijuana.  Allergies  Allergen Reactions  .  Ace Inhibitors Itching and Rash    Other reaction(s): Cough    Family History  Problem Relation Age of Onset  . Cerebral aneurysm Mother   . Lymphoma Father   . Diabetes Child   . Breast cancer Neg Hx      Prior to Admission medications   Medication Sig Start Date End Date Taking? Authorizing Provider  cefdinir (OMNICEF) 300 MG capsule Take 1 capsule (300 mg total) by mouth 2 (two) times daily for 5 days. 07/11/20 07/16/20  Naaman Plummer, MD  ondansetron (ZOFRAN ODT) 4 MG disintegrating tablet Take 1 tablet (4 mg total) by mouth every 8 (eight) hours as needed for nausea or  vomiting. 07/11/20   Naaman Plummer, MD    Physical Exam: Vitals:   07/13/20 0515 07/13/20 0600 07/13/20 0715  BP: 137/79 (!) 142/61 (!) 150/72  Pulse: 74 73 72  Resp: 19 18 18   Temp: 98.7 F (37.1 C)  100 F (37.8 C)  TempSrc: Oral  Oral  SpO2: 100% 100% 99%     Vitals:   07/13/20 0515 07/13/20 0600 07/13/20 0715  BP: 137/79 (!) 142/61 (!) 150/72  Pulse: 74 73 72  Resp: 19 18 18   Temp: 98.7 F (37.1 C)  100 F (37.8 C)  TempSrc: Oral  Oral  SpO2: 100% 100% 99%   General: overweight older woman in no acute distress Eyes: PERRL, lids and conjunctivae normal ENMT: Mucous membranes are moist. Posterior pharynx clear of any exudate or lesions.Normal dentition.  Neck: normal but obese, supple, no masses, no thyromegaly Respiratory: clear to auscultation bilaterally, no wheezing, no crackles. Normal respiratory effort. No accessory muscle use.  Cardiovascular: Regular rate and rhythm, no murmurs / rubs / gallops. No extremity edema. 2+ pedal pulses. No carotid bruits.  Abdomen: obese, diffuse tenderness,  Exam hindered by girth no masses palpated. No hepatosplenomegaly. Bowel sounds positive.  Musculoskeletal: no clubbing / cyanosis. No joint deformity upper and lower extremities. Good ROM, no contractures. Normal muscle tone.  Skin: no rashes, lesions, ulcers. No induration Neurologic: CN 2-12 nl facial symmetry and movement, no droop, no deviation of tongue, nl shoulder shrug. MS 5/5 thru-out Sensation intact to light touch, DTR hyper reactive at patellar tendon.  Psychiatric: Normal judgment and insight. Alert and oriented x 3. Normal mood.     Labs on Admission: I have personally reviewed following labs and imaging studies  CBC: Recent Labs  Lab 07/11/20 1853 07/13/20 0521  WBC 15.4* 7.9  NEUTROABS  --  6.2  HGB 12.4 10.3*  HCT 38.1 32.0*  MCV 87.2 88.4  PLT 189 287*   Basic Metabolic Panel: Recent Labs  Lab 07/11/20 1853 07/13/20 0521  NA 136 136  K 3.8  3.7  CL 101 102  CO2 27 23  GLUCOSE 225* 231*  BUN 20 23  CREATININE 1.22* 1.17*  CALCIUM 8.8* 8.2*   GFR: Estimated Creatinine Clearance: 51 mL/min (A) (by C-G formula based on SCr of 1.17 mg/dL (H)). Liver Function Tests: Recent Labs  Lab 07/13/20 0521  AST 20  ALT 13  ALKPHOS 62  BILITOT 0.8  PROT 6.5  ALBUMIN 2.8*   No results for input(s): LIPASE, AMYLASE in the last 168 hours. No results for input(s): AMMONIA in the last 168 hours. Coagulation Profile: No results for input(s): INR, PROTIME in the last 168 hours. Cardiac Enzymes: Recent Labs  Lab 07/13/20 0521  CKTOTAL 285*   BNP (last 3 results) No results for input(s): PROBNP in the  last 8760 hours. HbA1C: No results for input(s): HGBA1C in the last 72 hours. CBG: Recent Labs  Lab 07/13/20 0551  GLUCAP 228*   Lipid Profile: No results for input(s): CHOL, HDL, LDLCALC, TRIG, CHOLHDL, LDLDIRECT in the last 72 hours. Thyroid Function Tests: No results for input(s): TSH, T4TOTAL, FREET4, T3FREE, THYROIDAB in the last 72 hours. Anemia Panel: No results for input(s): VITAMINB12, FOLATE, FERRITIN, TIBC, IRON, RETICCTPCT in the last 72 hours. Urine analysis:    Component Value Date/Time   COLORURINE YELLOW (A) 07/11/2020 1853   APPEARANCEUR HAZY (A) 07/11/2020 1853   LABSPEC 1.023 07/11/2020 1853   PHURINE 5.0 07/11/2020 1853   GLUCOSEU 50 (A) 07/11/2020 1853   HGBUR SMALL (A) 07/11/2020 1853   BILIRUBINUR NEGATIVE 07/11/2020 1853   KETONESUR NEGATIVE 07/11/2020 1853   PROTEINUR >=300 (A) 07/11/2020 1853   NITRITE NEGATIVE 07/11/2020 1853   LEUKOCYTESUR SMALL (A) 07/11/2020 1853    Radiological Exams on Admission: DG Chest 2 View  Result Date: 07/13/2020 CLINICAL DATA:  Fall. EXAM: CHEST - 2 VIEW COMPARISON:  10/15/2019. FINDINGS: Mediastinum and hilar structures normal. Heart size normal. No pulmonary venous congestion. Low lung volumes with bibasilar atelectasis. Mild bibasilar interstitial  prominence. Bibasilar interstitial edema and or pneumonitis cannot be excluded. No pleural effusion or pneumothorax. IMPRESSION: Low lung volumes with bibasilar atelectasis. Mild bibasilar interstitial prominence. Bibasilar interstitial edema and or pneumonitis cannot be excluded. Electronically Signed   By: Marcello Moores  Register   On: 07/13/2020 06:34   CT Head Wo Contrast  Result Date: 07/13/2020 CLINICAL DATA:  Unwitnessed fall, altered mental status, head injury EXAM: CT HEAD WITHOUT CONTRAST CT CERVICAL SPINE WITHOUT CONTRAST TECHNIQUE: Multidetector CT imaging of the head and cervical spine was performed following the standard protocol without intravenous contrast. Multiplanar CT image reconstructions of the cervical spine were also generated. COMPARISON:  None. FINDINGS: CT HEAD FINDINGS Brain: Normal anatomic configuration. Parenchymal volume loss is commensurate with the patient's age. Mild periventricular white matter changes are present likely reflecting the sequela of small vessel ischemia. No abnormal intra or extra-axial mass lesion or fluid collection. No abnormal mass effect or midline shift. No evidence of acute intracranial hemorrhage or infarct. Ventricular size is normal. Cerebellum unremarkable. Vascular: No asymmetric hyperdense vasculature at the skull base. Skull: Intact Sinuses/Orbits: Moderate mucosal thickening is noted within the a maxillary sinuses bilaterally and ethmoid air cells bilaterally. No air-fluid levels. The frontal sinuses are hypoplastic. Other: Mastoid air cells and middle ear cavities are clear. CT CERVICAL SPINE FINDINGS Alignment: Normal cervical lordosis.  No listhesis. Skull base and vertebrae: The craniocervical junction is unremarkable. The atlantodental interval is normal. No acute fracture of the cervical spine. Soft tissues and spinal canal: No prevertebral fluid or swelling. No visible canal hematoma. Disc levels: Review of the sagittal images demonstrates  preservation of vertebral body height. There is mild intervertebral disc space narrowing and endplate remodeling of a C4-C7 in keeping with changes of mild degenerative disc disease. Spinal canal is widely patent. Prevertebral soft tissues are not thickened. Review of the axial images demonstrates multilevel uncovertebral and facet arthrosis without significant associated neural foraminal narrowing. Upper chest: Unremarkable Other: None IMPRESSION: No acute intracranial abnormality.  No calvarial fracture. Moderate paranasal sinus disease. No acute fracture or listhesis of the cervical spine. Electronically Signed   By: Fidela Salisbury MD   On: 07/13/2020 06:39   CT Cervical Spine Wo Contrast  Result Date: 07/13/2020 CLINICAL DATA:  Unwitnessed fall, altered mental status, head injury EXAM:  CT HEAD WITHOUT CONTRAST CT CERVICAL SPINE WITHOUT CONTRAST TECHNIQUE: Multidetector CT imaging of the head and cervical spine was performed following the standard protocol without intravenous contrast. Multiplanar CT image reconstructions of the cervical spine were also generated. COMPARISON:  None. FINDINGS: CT HEAD FINDINGS Brain: Normal anatomic configuration. Parenchymal volume loss is commensurate with the patient's age. Mild periventricular white matter changes are present likely reflecting the sequela of small vessel ischemia. No abnormal intra or extra-axial mass lesion or fluid collection. No abnormal mass effect or midline shift. No evidence of acute intracranial hemorrhage or infarct. Ventricular size is normal. Cerebellum unremarkable. Vascular: No asymmetric hyperdense vasculature at the skull base. Skull: Intact Sinuses/Orbits: Moderate mucosal thickening is noted within the a maxillary sinuses bilaterally and ethmoid air cells bilaterally. No air-fluid levels. The frontal sinuses are hypoplastic. Other: Mastoid air cells and middle ear cavities are clear. CT CERVICAL SPINE FINDINGS Alignment: Normal cervical  lordosis.  No listhesis. Skull base and vertebrae: The craniocervical junction is unremarkable. The atlantodental interval is normal. No acute fracture of the cervical spine. Soft tissues and spinal canal: No prevertebral fluid or swelling. No visible canal hematoma. Disc levels: Review of the sagittal images demonstrates preservation of vertebral body height. There is mild intervertebral disc space narrowing and endplate remodeling of a C4-C7 in keeping with changes of mild degenerative disc disease. Spinal canal is widely patent. Prevertebral soft tissues are not thickened. Review of the axial images demonstrates multilevel uncovertebral and facet arthrosis without significant associated neural foraminal narrowing. Upper chest: Unremarkable Other: None IMPRESSION: No acute intracranial abnormality.  No calvarial fracture. Moderate paranasal sinus disease. No acute fracture or listhesis of the cervical spine. Electronically Signed   By: Fidela Salisbury MD   On: 07/13/2020 06:39   CT Renal Stone Study  Result Date: 07/11/2020 CLINICAL DATA:  Left-sided flank pain. EXAM: CT ABDOMEN AND PELVIS WITHOUT CONTRAST TECHNIQUE: Multidetector CT imaging of the abdomen and pelvis was performed following the standard protocol without IV contrast. COMPARISON:  None. FINDINGS: Lower chest: There are a few hazy airspace opacities in the left lower lobe. There is a trace right-sided pleural effusion. The heart size is normal. Hepatobiliary: The liver is normal. Cholelithiasis without acute inflammation.There is no biliary ductal dilation. Pancreas: Normal contours without ductal dilatation. No peripancreatic fluid collection. Spleen: There are multiple calcifications throughout the patient's spleen. Adrenals/Urinary Tract: --Adrenal glands: Unremarkable. --Right kidney/ureter: No hydronephrosis or radiopaque kidney stones. --Left kidney/ureter: No hydronephrosis or radiopaque kidney stones. --Urinary bladder: There is gas  within the urinary bladder. Stomach/Bowel: --Stomach/Duodenum: No hiatal hernia or other gastric abnormality. Normal duodenal course and caliber. --Small bowel: Unremarkable. --Colon: There is an above average amount of stool throughout the colon. --Appendix: Not visualized. No right lower quadrant inflammation or free fluid. Vascular/Lymphatic: Atherosclerotic calcification is present within the non-aneurysmal abdominal aorta, without hemodynamically significant stenosis. --No retroperitoneal lymphadenopathy. --No mesenteric lymphadenopathy. --No pelvic or inguinal lymphadenopathy. Reproductive: Status post hysterectomy. No adnexal mass. Other: No ascites or free air. The abdominal wall is normal. Musculoskeletal. No acute displaced fractures. IMPRESSION: 1. No acute abdominopelvic abnormality. 2. There are a few hazy airspace opacities in the left lower lobe, concerning for pneumonia or aspiration. There is a trace right-sided pleural effusion. 3. There is gas within the urinary bladder. Correlate for recent instrumentation. Findings can also be seen in cystitis caused by gas-forming organism. 4. Cholelithiasis without acute inflammation. Aortic Atherosclerosis (ICD10-I70.0). Electronically Signed   By: Constance Holster M.D.   On: 07/11/2020  22:20    EKG: Independently reviewed. NSR w/o acute changes  Assessment/Plan Active Problems:   LOC (loss of consciousness) (HCC)   DM2 (diabetes mellitus, type 2) ([place])   Diabetic nephropathy associated with type 2 diabetes mellitus ([place])   Cardiac arrhythmia   COVID-19 virus infection   Cystitis   GERD (gastroesophageal reflux disease)  (please populate well all problems here in Problem List. (For example, if patient is on BP meds at home and you resume or decide to hold them, it is a problem that needs to be her. Same for CAD, COPD, HLD and so on)   1. LOC - unwitnessed with post-event confusion. No prior h/o seizure but has had CVA x 3. Differential  includes dehydration with syncope, cardiac arrythmia with recent hospital eval 05/23/20, new onset seizure.  Plan Tele admit  Not enough clear evidence to initiate anti-seizure meds  MRI brain to r/o new CVA, r/o old infarct that could be seizure focus  EEG  Seizure precautions  2. Covid infection - positive test today. She has diffuse myalgias of recent onset w/o fever or respiratory symptoms. She reports being fully vaccinated.  Plan Covid precautions  Monoclonal antibody therapy due to multiple risk factors including DM, HTN, obesity.  3. Cardiac - patient gives a history worrisome for exertional angina that has been progressive. She has multiple cardiac risk factors. She denies having cardiac testing in the last year. Denies h/o cardiac cath.  Plan Tele admit  Cycle troponins  2 D echo  Cardiology consult- paged Dr. Ubaldo Glassing for [facility] cardiology  4. DM - difficult to control. Plan Continue home dose of levemir  Sliding scale coverage   5. GERD - by history. No active symptoms or evidence of GI bleeds  Plan D/c H2 blocker  Increase PPI to BID  6. Cystitis - patient seen 12/26 in [facility] diagnosed with cystitis. Rx provided but never started.  Plan Rocephin 1 g q 24    DVT prophylaxis: lovenox  Code Status: full code  Family Communication: spoke with son Yvone Neu. Explained Dx workup plan. He agrees and is glad we are addressing cardiac symptoms.   Disposition Plan: home when medically stable  Consults called: Cardiology - Dr. Ubaldo Glassing - left call back on pager and sent text msg.   Admission status: inpatient/tele    Adella Hare MD [facility] Pager [phone]  If 7PM-7AM, please contact night-coverage [email or web address] Password [facility]  07/13/2020, 9:53 AM

## 2020-07-13 NOTE — Progress Notes (Signed)
PHARMACIST - PHYSICIAN COMMUNICATION  CONCERNING:  Enoxaparin (Lovenox) for DVT Prophylaxis    RECOMMENDATION: Patient was prescribed enoxaparin 40 mg q24 hours for VTE prophylaxis.   There were no vitals filed for this visit.  There is no height or weight on file to calculate BMI.  Estimated Creatinine Clearance: 48.9 mL/min (A) (by C-G formula based on SCr of 1.22 mg/dL (H)).   Based on [place] patient is candidate for enoxaparin 0.5mg /kg TBW SQ every 24 hours based on BMI being >30.  DESCRIPTION: Pharmacy has adjusted enoxaparin dose per [facility] policy.  Patient is now receiving enoxaparin 45 mg every 24 hours    Tawnya Crook, PharmD Clinical Pharmacist  07/13/2020 2:02 PM

## 2020-07-13 NOTE — ED Notes (Signed)
Responded to call bell. Pt purewick displaced and pt urinated on chux pads. Pt cleaned and fresh linens and chux pads placed. Will replace purewick when patient returns from MRI.

## 2020-07-13 NOTE — Progress Notes (Signed)
Inpatient Diabetes Program Recommendations  AACE/ADA: New Consensus Statement on Inpatient Glycemic Control   Target Ranges:  Prepandial:   less than 140 mg/dL      Peak postprandial:   less than 180 mg/dL (1-2 hours)      Critically ill patients:  140 - 180 mg/dL   Results for ZAHLI, VETSCH (MRN [id number]) as of 07/13/2020 13:37  Ref. Range 07/13/2020 05:51 07/13/2020 12:44  Glucose-Capillary Latest Ref Range: 70 - 99 mg/dL 228 (H) 210 (H)   Review of Glycemic Control  Diabetes history: DM2 Outpatient Diabetes medications: Levemir 15 units QAM, Levemir 65 units QHS, Novolog 15-20 units TID with meals Current orders for Inpatient glycemic control: Levemir 15 units QAM, Levemir 65 units QHS  Inpatient Diabetes Program Recommendations:    Insulin: Please consider decreasing bedtime Levemir to 20 units QHS and ordering CBGs AC&HS with Novolog 0-15 units TID with meals and Novolog 0-5 units QHS.  NOTE: Noted patient in ED and being admitted with loss of consciousness and found to be COVID positive. Per chart review noted patient had initial visit with Dr. Gabriel Carina (Endocrinologist) and patient's A1C was 13% on 05/28/20 and per office note patient was advised to increase Levemir to 65 units QHS and take Novolog 15 units TID with meals and asked to stop taking Jardiance and Victoza. Patient seen PCP on 06/07/20 and was asked to add Levemir 15 units QAM. Would recommend decreasing bedtime dose of Levemir to 20 units QHS and adding Novolog correction. Will continue to follow glucose trends and make further recommendations if needed.  Thanks, Barnie Alderman, RN, MSN, CDE Diabetes Coordinator Inpatient Diabetes Program [phone] (Team Pager from 8am to 5pm)

## 2020-07-13 NOTE — ED Provider Notes (Signed)
[facility] Emergency Department Provider Note  ____________________________________________  Time seen: Approximately 5:47 AM  I have reviewed the triage vital signs and the nursing notes.   HISTORY  Chief Complaint Fall and Urinary Tract Infection   HPI Mariah Forbes is a 64 y.o. female with a history of diabetes, hypertension, kidney disease who presents for possible fall versus syncope.   Patient was seen in the emergency room 2 days ago for abdominal pain and flank pain and was diagnosed with a UTI.  She was discharged home with a prescription that she hasn't started yet.  Last thing the patient remembers was trying to get up from her recliner yesterday in the afternoon.  Her son last saw patient around 55 noon/1 PM.  Patient reports having difficulty getting up from her recliner and she thinks that she might have lost her balance and fell.  Patient regained consciousness at 3 AM.  She thought it was actually 3 PM and called her son who was concerned and came to check on her.  Patient has been on the floor for over 12 hours.  She is covered in urine.  No tongue trauma.  She is complaining of diffuse body pain, headache, neck pain.  She denies any chest pain or shortness of breath.  She denies any prior history of seizure with syncope.  She denies any tongue trauma.  Patient reports a cough for about a week.  Her son was sick with cold-like symptoms last week.  She denies of any body aches until waking up in the floor this evening.  She denies sore throat or any known fevers.  Past Medical History:  Diagnosis Date  . Diabetes mellitus without complication ([place])   . Hypertension   . Renal disorder     Past Surgical History:  Procedure Laterality Date  . ABDOMINAL HYSTERECTOMY    . APPENDECTOMY    . BREAST EXCISIONAL BIOPSY Bilateral    2 benign lumps, 1980 and 2015  . BREAST LUMPECTOMY Right     Prior to Admission medications   Medication Sig Start  Date End Date Taking? Authorizing Provider  cefdinir (OMNICEF) 300 MG capsule Take 1 capsule (300 mg total) by mouth 2 (two) times daily for 5 days. 07/11/20 07/16/20  Naaman Plummer, MD  ondansetron (ZOFRAN ODT) 4 MG disintegrating tablet Take 1 tablet (4 mg total) by mouth every 8 (eight) hours as needed for nausea or vomiting. 07/11/20   Naaman Plummer, MD    Allergies Ace inhibitors  Family History  Problem Relation Age of Onset  . Breast cancer Neg Hx     Social History Social History   Tobacco Use  . Smoking status: Former Research scientist (life sciences)  . Smokeless tobacco: Never Used  Substance Use Topics  . Alcohol use: Yes  . Drug use: Yes    Frequency: 1.0 times per week    Types: Marijuana    Review of Systems  Constitutional: Negative for fever. + LOC, generalized body pain Eyes: Negative for visual changes. ENT: Negative for facial injury. + neck pain Cardiovascular: Negative for chest injury. Respiratory: Negative for shortness of breath. Negative for chest wall injury. + cough Gastrointestinal: Negative for abdominal pain or injury. Genitourinary: Negative for dysuria. Musculoskeletal: Negative for back injury, negative for arm or leg pain. Skin: Negative for laceration/abrasions. Neurological: + head pain   ____________________________________________   PHYSICAL EXAM:  VITAL SIGNS: ED Triage Vitals  Enc Vitals Group     BP 07/13/20 0515  137/79     Pulse Rate 07/13/20 0515 74     Resp 07/13/20 0515 19     Temp 07/13/20 0515 98.7 F (37.1 C)     Temp Source 07/13/20 0515 Oral     SpO2 07/13/20 0515 100 %     Weight --      Height --      Head Circumference --      Peak Flow --      Pain Score 07/13/20 0519 8     Pain Loc --      Pain Edu? --      Excl. in [place]? --     Full spinal precautions maintained throughout the trauma exam. Constitutional: Alert and oriented. No acute distress. Does not appear intoxicated. Incontinent of urine HEENT Head: Normocephalic  and atraumatic. Face: No facial bony tenderness. Stable midface Ears: No hemotympanum bilaterally. No Battle sign Eyes: No eye injury. PERRL. No raccoon eyes Nose: Nontender. No epistaxis. No rhinorrhea Mouth/Throat: Mucous membranes are moist. No oropharyngeal blood. No dental injury. Airway patent without stridor. Normal voice. Neck: no C-collar. No midline c-spine tenderness. Diffuse paraspinal tenderness, no stepoffs Cardiovascular: Normal rate, regular rhythm. Normal and symmetric distal pulses are present in all extremities. Pulmonary/Chest: Chest wall is stable and nontender to palpation/compression. Normal respiratory effort. Breath sounds are normal. No crepitus.  Abdominal: Soft, nontender, non distended. Musculoskeletal: Nontender with normal full range of motion in all extremities. No deformities. No thoracic or lumbar midline spinal tenderness. Pelvis is stable. Skin: Skin is warm, dry and intact. No abrasions or contutions. Psychiatric: Speech and behavior are appropriate. Neurological: Normal speech and language. Moves all extremities to command. No gross focal neurologic deficits are appreciated.  Glascow Coma Score: 4 - Opens eyes on own 6 - Follows simple motor commands 5 - Alert and oriented GCS: 15   ____________________________________________   LABS (all labs ordered are listed, but only abnormal results are displayed)  Labs Reviewed  RESP PANEL BY RT-PCR (FLU A&B, COVID) ARPGX2 - Abnormal; Notable for the following components:      Result Value   SARS Coronavirus 2 by RT PCR POSITIVE (*)    All other components within normal limits  CBC WITH DIFFERENTIAL/PLATELET - Abnormal; Notable for the following components:   RBC 3.62 (*)    Hemoglobin 10.3 (*)    HCT 32.0 (*)    Platelets 137 (*)    All other components within normal limits  COMPREHENSIVE METABOLIC PANEL - Abnormal; Notable for the following components:   Glucose, Bld 231 (*)    Creatinine, Ser  1.17 (*)    Calcium 8.2 (*)    Albumin 2.8 (*)    GFR, Estimated 52 (*)    All other components within normal limits  CK - Abnormal; Notable for the following components:   Total CK 285 (*)    All other components within normal limits  CBG MONITORING, ED - Abnormal; Notable for the following components:   Glucose-Capillary 228 (*)    All other components within normal limits  PROCALCITONIN  TROPONIN I (HIGH SENSITIVITY)   ____________________________________________  EKG  ED ECG REPORT I, Rudene Re, the attending physician, personally viewed and interpreted this ECG.  Normal sinus rhythm, rate of 74, normal intervals, normal axis, no ST elevations or depressions.  Unchanged from prior from March 2021. ____________________________________________  RADIOLOGY  I have personally reviewed the images performed during this visit and I agree with the Radiologist's read.   Interpretation  by Radiologist:  DG Chest 2 View  Result Date: 07/13/2020 CLINICAL DATA:  Fall. EXAM: CHEST - 2 VIEW COMPARISON:  10/15/2019. FINDINGS: Mediastinum and hilar structures normal. Heart size normal. No pulmonary venous congestion. Low lung volumes with bibasilar atelectasis. Mild bibasilar interstitial prominence. Bibasilar interstitial edema and or pneumonitis cannot be excluded. No pleural effusion or pneumothorax. IMPRESSION: Low lung volumes with bibasilar atelectasis. Mild bibasilar interstitial prominence. Bibasilar interstitial edema and or pneumonitis cannot be excluded. Electronically Signed   By: Marcello Moores  Register   On: 07/13/2020 06:34   CT Head Wo Contrast  Result Date: 07/13/2020 CLINICAL DATA:  Unwitnessed fall, altered mental status, head injury EXAM: CT HEAD WITHOUT CONTRAST CT CERVICAL SPINE WITHOUT CONTRAST TECHNIQUE: Multidetector CT imaging of the head and cervical spine was performed following the standard protocol without intravenous contrast. Multiplanar CT image  reconstructions of the cervical spine were also generated. COMPARISON:  None. FINDINGS: CT HEAD FINDINGS Brain: Normal anatomic configuration. Parenchymal volume loss is commensurate with the patient's age. Mild periventricular white matter changes are present likely reflecting the sequela of small vessel ischemia. No abnormal intra or extra-axial mass lesion or fluid collection. No abnormal mass effect or midline shift. No evidence of acute intracranial hemorrhage or infarct. Ventricular size is normal. Cerebellum unremarkable. Vascular: No asymmetric hyperdense vasculature at the skull base. Skull: Intact Sinuses/Orbits: Moderate mucosal thickening is noted within the a maxillary sinuses bilaterally and ethmoid air cells bilaterally. No air-fluid levels. The frontal sinuses are hypoplastic. Other: Mastoid air cells and middle ear cavities are clear. CT CERVICAL SPINE FINDINGS Alignment: Normal cervical lordosis.  No listhesis. Skull base and vertebrae: The craniocervical junction is unremarkable. The atlantodental interval is normal. No acute fracture of the cervical spine. Soft tissues and spinal canal: No prevertebral fluid or swelling. No visible canal hematoma. Disc levels: Review of the sagittal images demonstrates preservation of vertebral body height. There is mild intervertebral disc space narrowing and endplate remodeling of a C4-C7 in keeping with changes of mild degenerative disc disease. Spinal canal is widely patent. Prevertebral soft tissues are not thickened. Review of the axial images demonstrates multilevel uncovertebral and facet arthrosis without significant associated neural foraminal narrowing. Upper chest: Unremarkable Other: None IMPRESSION: No acute intracranial abnormality.  No calvarial fracture. Moderate paranasal sinus disease. No acute fracture or listhesis of the cervical spine. Electronically Signed   By: Fidela Salisbury MD   On: 07/13/2020 06:39   CT Cervical Spine Wo  Contrast  Result Date: 07/13/2020 CLINICAL DATA:  Unwitnessed fall, altered mental status, head injury EXAM: CT HEAD WITHOUT CONTRAST CT CERVICAL SPINE WITHOUT CONTRAST TECHNIQUE: Multidetector CT imaging of the head and cervical spine was performed following the standard protocol without intravenous contrast. Multiplanar CT image reconstructions of the cervical spine were also generated. COMPARISON:  None. FINDINGS: CT HEAD FINDINGS Brain: Normal anatomic configuration. Parenchymal volume loss is commensurate with the patient's age. Mild periventricular white matter changes are present likely reflecting the sequela of small vessel ischemia. No abnormal intra or extra-axial mass lesion or fluid collection. No abnormal mass effect or midline shift. No evidence of acute intracranial hemorrhage or infarct. Ventricular size is normal. Cerebellum unremarkable. Vascular: No asymmetric hyperdense vasculature at the skull base. Skull: Intact Sinuses/Orbits: Moderate mucosal thickening is noted within the a maxillary sinuses bilaterally and ethmoid air cells bilaterally. No air-fluid levels. The frontal sinuses are hypoplastic. Other: Mastoid air cells and middle ear cavities are clear. CT CERVICAL SPINE FINDINGS Alignment: Normal cervical lordosis.  No  listhesis. Skull base and vertebrae: The craniocervical junction is unremarkable. The atlantodental interval is normal. No acute fracture of the cervical spine. Soft tissues and spinal canal: No prevertebral fluid or swelling. No visible canal hematoma. Disc levels: Review of the sagittal images demonstrates preservation of vertebral body height. There is mild intervertebral disc space narrowing and endplate remodeling of a C4-C7 in keeping with changes of mild degenerative disc disease. Spinal canal is widely patent. Prevertebral soft tissues are not thickened. Review of the axial images demonstrates multilevel uncovertebral and facet arthrosis without significant  associated neural foraminal narrowing. Upper chest: Unremarkable Other: None IMPRESSION: No acute intracranial abnormality.  No calvarial fracture. Moderate paranasal sinus disease. No acute fracture or listhesis of the cervical spine. Electronically Signed   By: Fidela Salisbury MD   On: 07/13/2020 06:39      ____________________________________________   PROCEDURES  Procedure(s) performed:yes .1-3 Lead EKG Interpretation Performed by: Rudene Re, MD Authorized by: Rudene Re, MD     Interpretation: normal     ECG rate assessment: normal     Rhythm: sinus rhythm     Ectopy: none     Critical Care performed: yes  CRITICAL CARE Performed by: Rudene Re  ?  Total critical care time: 40 min  Critical care time was exclusive of separately billable procedures and treating other patients.  Critical care was necessary to treat or prevent imminent or life-threatening deterioration.  Critical care was time spent personally by me on the following activities: development of treatment plan with patient and/or surrogate as well as nursing, discussions with consultants, evaluation of patient's response to treatment, examination of patient, obtaining history from patient or surrogate, ordering and performing treatments and interventions, ordering and review of laboratory studies, ordering and review of radiographic studies, pulse oximetry and re-evaluation of patient's condition.  ____________________________________________   INITIAL IMPRESSION / ASSESSMENT AND PLAN / ED COURSE  64 y.o. female with a history of diabetes, hypertension, kidney disease who presents for possible fall versus syncope.   Patient seems to be missing 12 hours of her day.  She is incontinent of urine.  She is now neurologically intact, alert and oriented x3.  She is complaining of diffuse headache and neck pain and also diffuse body aches.  Patient was seen here 2 days ago for abdominal pain and  flank pain and diagnosed with a UTI.  + cough for 1 week. Vaccinated against Covid. Son was sick last week. She has not been started the antibiotics yet for the UTI. No culture sent.  Review of patient's recent visit 2 days ago shows a CT renal concerning for left lower lobe pneumonia and gas in the urinary bladder concerning for gas-forming cystitis.  No signs of sepsis at that time.  Patient was given a dose of Rocephin before being discharged home.  Ddx syncope, fall with + LOC and concussion, seizure, dehydration, sepsis, rhabdo, PNA, covid, flu  Will check labs, CK, EKG, troponin, head CT, CT cervical spine, Covid, flu, CXR. Will monitor closely on telemetry.   History gathered from patient and her son was at bedside.  Plan discussed with both of them.    _________________________ 6:52 AM on 07/13/2020 ----------------------------------------- CT head and cervical spine visualized by me with no acute traumatic injuries, confirmed by radiology.  Chest x-ray concerning for pneumonia.  Covid and flu pending.  We will add a procalcitonin.  Patient is not hypoxic but with due to the prolonged loss of consciousness and urinary incontinence will  admit to the hospitalist service for possible new onset of seizures.  Labs with no signs of hypoglycemia, significant electrolyte derangements, leukocytosis, DKA, AKI, or rhabdo.  Troponin is normal.  Patient does have a hemoglobin of 10.3 which is lower than 2 days ago at 12.4.  She is not on any blood thinners and denies hematuria, melena, hematemesis, hematochezia, or any other bleeding.  _________________________ 7:01 AM on 07/13/2020 -----------------------------------------  Covid positive.       ____________________________________________  Please note:  Patient was evaluated in Emergency Department today for the symptoms described in the history of present illness. Patient was evaluated in the context of the global COVID-19 pandemic, which  necessitated consideration that the patient might be at risk for infection with the SARS-CoV-2 virus that causes COVID-19. Institutional protocols and algorithms that pertain to the evaluation of patients at risk for COVID-19 are in a state of rapid change based on information released by regulatory bodies including the CDC and federal and state organizations. These policies and algorithms were followed during the patient's care in the ED.  Some ED evaluations and interventions may be delayed as a result of limited staffing during the pandemic.   ____________________________________________   FINAL CLINICAL IMPRESSION(S) / ED DIAGNOSES   Final diagnoses:  Loss of consciousness of moderate duration  COVID-19      NEW MEDICATIONS STARTED DURING THIS VISIT:  ED Discharge Orders    None       Note:  This document was prepared using Dragon voice recognition software and may include unintentional dictation errors.    Alfred Levins, [place], MD 07/13/20 [phone]

## 2020-07-13 NOTE — ED Triage Notes (Signed)
Pt presents to ER via ems following a fall at home.  Unknown what time pt fell.  Per ems, pt's son called pt and noticed she was slightly disoriented.  Son came to house finding pt lying on floor, awake and alert.  Unknown how long pt was lying down for.  Pt seen here Monday and treated for UTI but has not started her abx yet.  Pt currently A&O x4 at this time.  Pt states she did hit her head when she fell.

## 2020-07-13 NOTE — Progress Notes (Signed)
[place] paged to assist w/AD education/completion.  When [facility] arrived, pt. in CT but son in rm.  Son shared pt. called him this AM at 3:30 thinking it was the afternoon; she seems to have fallen and not remembered the previous 12 hours.  Pt. was admitted to [facility] this past weekend but discharged Monday.  [place] provided AD document and briefly explained contents; son will discuss w/pt. and complete document --> will call chaplains if pt. is admitted to get assistance w/notarization.  [place] spoke briefly w/pt. after she came back from CT; no needs expressed at this time.

## 2020-07-14 ENCOUNTER — Inpatient Hospital Stay
Admit: 2020-07-14 | Discharge: 2020-07-14 | Disposition: A | Discharge status: Home / Self Care | Payer: Medicare HMO | Attending: Internal Medicine | Admitting: Internal Medicine

## 2020-07-14 DIAGNOSIS — K219 Gastro-esophageal reflux disease without esophagitis: Secondary | ICD-10-CM

## 2020-07-14 DIAGNOSIS — R4020 Unspecified coma: Secondary | ICD-10-CM

## 2020-07-14 DIAGNOSIS — E1122 Type 2 diabetes mellitus with diabetic chronic kidney disease: Secondary | ICD-10-CM | POA: Diagnosis not present

## 2020-07-14 DIAGNOSIS — N182 Chronic kidney disease, stage 2 (mild): Secondary | ICD-10-CM

## 2020-07-14 DIAGNOSIS — U071 COVID-19: Secondary | ICD-10-CM | POA: Diagnosis not present

## 2020-07-14 DIAGNOSIS — Z794 Long term (current) use of insulin: Secondary | ICD-10-CM

## 2020-07-14 DIAGNOSIS — I499 Cardiac arrhythmia, unspecified: Secondary | ICD-10-CM

## 2020-07-14 LAB — COMPREHENSIVE METABOLIC PANEL
ALT: 13 U/L (ref 0–44)
AST: 22 U/L (ref 15–41)
Albumin: 2.6 g/dL — ABNORMAL LOW (ref 3.5–5.0)
Alkaline Phosphatase: 60 U/L (ref 38–126)
Anion gap: 8 (ref 5–15)
BUN: 21 mg/dL (ref 8–23)
CO2: 25 mmol/L (ref 22–32)
Calcium: 8 mg/dL — ABNORMAL LOW (ref 8.9–10.3)
Chloride: 100 mmol/L (ref 98–111)
Creatinine, Ser: 1.31 mg/dL — ABNORMAL HIGH (ref 0.44–1.00)
GFR, Estimated: 45 mL/min — ABNORMAL LOW (ref >60)
Glucose, Bld: 86 mg/dL (ref 70–99)
Potassium: 3.6 mmol/L (ref 3.5–5.1)
Sodium: 133 mmol/L — ABNORMAL LOW (ref 135–145)
Total Bilirubin: 0.7 mg/dL (ref 0.3–1.2)
Total Protein: 6.4 g/dL — ABNORMAL LOW (ref 6.5–8.1)

## 2020-07-14 LAB — CBC
HCT: 32 % — ABNORMAL LOW (ref 36.0–46.0)
Hemoglobin: 10.2 g/dL — ABNORMAL LOW (ref 12.0–15.0)
MCH: 28.2 pg (ref 26.0–34.0)
MCHC: 31.9 g/dL (ref 30.0–36.0)
MCV: 88.4 fL (ref 80.0–100.0)
Platelets: 139 10*3/uL — ABNORMAL LOW (ref 150–400)
RBC: 3.62 MIL/uL — ABNORMAL LOW (ref 3.87–5.11)
RDW: 13.3 % (ref 11.5–15.5)
WBC: 5.9 10*3/uL (ref 4.0–10.5)
nRBC: 0 % (ref 0.0–0.2)

## 2020-07-14 LAB — C-REACTIVE PROTEIN: CRP: 12.6 mg/dL — ABNORMAL HIGH (ref <1.0)

## 2020-07-14 LAB — ECHOCARDIOGRAM COMPLETE
AR max vel: 1.9 cm2
AV Area VTI: 1.99 cm2
AV Area mean vel: 1.66 cm2
AV Mean grad: 6 mmHg
AV Peak grad: 9.6 mmHg
Ao pk vel: 1.55 m/s
Area-P 1/2: 4.15 cm2
S' Lateral: 2.27 cm

## 2020-07-14 LAB — CBG MONITORING, ED
Glucose-Capillary: 100 mg/dL — ABNORMAL HIGH (ref 70–99)
Glucose-Capillary: 233 mg/dL — ABNORMAL HIGH (ref 70–99)
Glucose-Capillary: 321 mg/dL — ABNORMAL HIGH (ref 70–99)
Glucose-Capillary: 67 mg/dL — ABNORMAL LOW (ref 70–99)

## 2020-07-14 LAB — URINE CULTURE: Culture: <10000 — AB

## 2020-07-14 LAB — FIBRIN DERIVATIVES D-DIMER (ARMC ONLY): Fibrin derivatives D-dimer (ARMC): 1238.28 ng/mL (FEU) — ABNORMAL HIGH (ref 0.00–499.00)

## 2020-07-14 MED ORDER — SODIUM CHLORIDE 0.9 % IV SOLN
200.0000 mg | Freq: Once | INTRAVENOUS | Status: AC
  Administered 2020-07-14: 200 mg via INTRAVENOUS
  Filled 2020-07-14: qty 200

## 2020-07-14 MED ORDER — INSULIN DETEMIR 100 UNIT/ML ~~LOC~~ SOLN
50.0000 [IU] | Freq: Every day | SUBCUTANEOUS | Status: DC
  Administered 2020-07-14: 50 [IU] via SUBCUTANEOUS
  Filled 2020-07-14 (×2): qty 0.5

## 2020-07-14 MED ORDER — IPRATROPIUM-ALBUTEROL 20-100 MCG/ACT IN AERS
1.0000 | INHALATION_SPRAY | Freq: Four times a day (QID) | RESPIRATORY_TRACT | Status: DC
  Administered 2020-07-14 – 2020-07-18 (×16): 1 via RESPIRATORY_TRACT
  Filled 2020-07-14 (×2): qty 4

## 2020-07-14 MED ORDER — ZINC SULFATE 220 (50 ZN) MG PO CAPS
220.0000 mg | ORAL_CAPSULE | Freq: Every day | ORAL | Status: DC
  Administered 2020-07-14 – 2020-07-18 (×5): 220 mg via ORAL
  Filled 2020-07-14 (×6): qty 1

## 2020-07-14 MED ORDER — ALBUTEROL SULFATE HFA 108 (90 BASE) MCG/ACT IN AERS
2.0000 | INHALATION_SPRAY | Freq: Four times a day (QID) | RESPIRATORY_TRACT | Status: DC | PRN
  Filled 2020-07-14: qty 6.7

## 2020-07-14 MED ORDER — HYDROCOD POLST-CPM POLST ER 10-8 MG/5ML PO SUER: 5.0000 mL | Freq: Two times a day (BID) | ORAL | Status: DC | PRN

## 2020-07-14 MED ORDER — INSULIN ASPART 100 UNIT/ML ~~LOC~~ SOLN
0.0000 [IU] | Freq: Three times a day (TID) | SUBCUTANEOUS | Status: DC
  Administered 2020-07-14 – 2020-07-15 (×3): 5 [IU] via SUBCUTANEOUS
  Administered 2020-07-15: 8 [IU] via SUBCUTANEOUS
  Administered 2020-07-16: 3 [IU] via SUBCUTANEOUS
  Administered 2020-07-16 (×2): 11 [IU] via SUBCUTANEOUS
  Administered 2020-07-17: 3 [IU] via SUBCUTANEOUS
  Administered 2020-07-17: 2 [IU] via SUBCUTANEOUS
  Administered 2020-07-17 – 2020-07-18 (×2): 3 [IU] via SUBCUTANEOUS
  Administered 2020-07-18: 5 [IU] via SUBCUTANEOUS
  Filled 2020-07-14 (×12): qty 1

## 2020-07-14 MED ORDER — SODIUM CHLORIDE 0.9 % IV SOLN
100.0000 mg | Freq: Every day | INTRAVENOUS | Status: AC
  Administered 2020-07-15 – 2020-07-18 (×4): 100 mg via INTRAVENOUS
  Filled 2020-07-14: qty 100
  Filled 2020-07-14 (×3): qty 20
  Filled 2020-07-14 (×2): qty 100

## 2020-07-14 MED ORDER — ADULT MULTIVITAMIN W/MINERALS CH
1.0000 | ORAL_TABLET | Freq: Every day | ORAL | Status: DC
  Administered 2020-07-14 – 2020-07-18 (×5): 1 via ORAL
  Filled 2020-07-14 (×6): qty 1

## 2020-07-14 MED ORDER — DEXAMETHASONE 4 MG PO TABS
6.0000 mg | ORAL_TABLET | ORAL | Status: DC
  Administered 2020-07-14 – 2020-07-18 (×5): 6 mg via ORAL
  Filled 2020-07-14 (×6): qty 1.5

## 2020-07-14 MED ORDER — GUAIFENESIN-DM 100-10 MG/5ML PO SYRP
10.0000 mL | ORAL_SOLUTION | ORAL | Status: DC | PRN
  Administered 2020-07-16 – 2020-07-18 (×3): 10 mL via ORAL
  Filled 2020-07-14 (×3): qty 10

## 2020-07-14 MED ORDER — ASCORBIC ACID 500 MG PO TABS
500.0000 mg | ORAL_TABLET | Freq: Every day | ORAL | Status: DC
  Administered 2020-07-14 – 2020-07-18 (×5): 500 mg via ORAL
  Filled 2020-07-14 (×6): qty 1

## 2020-07-14 MED ORDER — INSULIN ASPART 100 UNIT/ML ~~LOC~~ SOLN
0.0000 [IU] | Freq: Every day | SUBCUTANEOUS | Status: DC
  Administered 2020-07-15: 4 [IU] via SUBCUTANEOUS
  Administered 2020-07-15: 3 [IU] via SUBCUTANEOUS
  Filled 2020-07-14: qty 1

## 2020-07-14 NOTE — ED Notes (Signed)
Pt given warm blanket at this time, leads placed on pt

## 2020-07-14 NOTE — ED Notes (Signed)
Meal tray and warm blanket given at this time

## 2020-07-14 NOTE — Progress Notes (Signed)
*  PRELIMINARY RESULTS* Echocardiogram 2D Echocardiogram has been performed.  Mariah Forbes 07/14/2020, 10:43 AM

## 2020-07-14 NOTE — ED Notes (Signed)
R AC PIV removed due to infiltration

## 2020-07-14 NOTE — ED Notes (Signed)
Lab called at this time due to pt being hard stick and PIV not drawing back. Per lab, will send someone

## 2020-07-14 NOTE — Evaluation (Signed)
Occupational Therapy Evaluation Patient Details Name: Mariah Forbes MRN: [id number] DOB: 11-16-1955 Today's Date: 07/14/2020    History of Present Illness Mariah Forbes is a 64 y.o. female with history of diabetes, hypertension, renal disease who was brought to the emergency room after being found down by her family at her place of residence.  She was seen in the ER several days previously with a probable UTI.  Patient does not have any idea why she had fallen.  She had difficulty getting up from her recliner and feels like she fell.  She regained consciousness at 3 AM.  She apparently was on the floor for approximately 12 hours.  She was covered in urine.  She has a history CVA, chronic kidney disease, hyperlipidemia and hypertension.  She had cold-like symptoms earlier in the week.  EKG shows sinus rhythm with no ischemia.  EKG shows a chronic kidney disease with a creatinine of 1.17.  Electrolytes were otherwise normal.  High-sensitivity troponin was normal.  Chest x-ray suggested mild bibasilar interstitial prominence.  No obvious pulmonary congestion.  Brain CT showed no acute abnormality.  Cervical spine was cleared.  Patient states she quit smoking smokes marijuana every week.  Currently uses ethanol.  Denies chest pain.  Medications prior to admission Omnicef for her recent UTI.  Occasional Zofran.  She is Covid positive.   Clinical Impression   Patient presenting with decreased I in self care, balance, functional mobility/transfer, endurance, and safety awareness.  Patient reports living at home alone with use of SPC  PTA. Pt on 1 L via [place] upon entering the room and able to ambulate and perform toileting needs while remaining on RA throughout entire session with O2 saturation remaining at or above 90%. Patient currently functioning at min - mod A for balance and fatigues quickly. Pt needing assistance with clothing management and hygiene during toileting tasks. Pt reports granddaughter  positive for covid and son who lives near her is being tested. She is unsure if he would be able to assist her at home. We discussed need for short term rehab with pt verbalizing understanding and agreement . Patient will benefit from acute OT to increase overall independence in the areas of ADLs, functional mobility, safety awareness in order to safely discharge to next venue of care.    Follow Up Recommendations  SNF;Supervision/Assistance - 24 hour    Equipment Recommendations  Other (comment) (defer to next venue of care)       Precautions / Restrictions Precautions Precautions: Fall      Mobility Bed Mobility Overal bed mobility: Needs Assistance Bed Mobility: Supine to Sit;Sit to Supine     Supine to sit: Min assist Sit to supine: Mod assist   General bed mobility comments: assist for B LEs to return to bed. Min cuing for proper technique and hand placement    Transfers Overall transfer level: Needs assistance Equipment used: 1 person hand held assist Transfers: Sit to/from [facility] Sit to Stand: Min assist;Mod assist Stand pivot transfers: Mod assist       General transfer comment: mod A needed for lower surface sit <>stand. Pt ambulating with 2 LOBs requiring assistance to correct.    Balance Overall balance assessment: Needs assistance Sitting-balance support: Feet supported Sitting balance-Leahy Scale: Good     Standing balance support: During functional activity Standing balance-Leahy Scale: Poor  ADL either performed or assessed with clinical judgement   ADL Overall ADL's : Needs assistance/impaired                         Toilet Transfer: Moderate assistance;Ambulation;Regular Toilet;Grab bars   Toileting- Clothing Manipulation and Hygiene: Moderate assistance;Sit to/from stand         General ADL Comments: posterior bias in standing with clothing management and hygiene      Vision Patient Visual Report: No change from baseline              Pertinent Vitals/Pain Pain Assessment: No/denies pain     Hand Dominance Right   Extremity/Trunk Assessment Upper Extremity Assessment Upper Extremity Assessment: Generalized weakness   Lower Extremity Assessment Lower Extremity Assessment: Generalized weakness   Cervical / Trunk Assessment Cervical / Trunk Assessment: Normal   Communication Communication Communication: No difficulties   Cognition Arousal/Alertness: Awake/alert Behavior During Therapy: WFL for tasks assessed/performed Overall Cognitive Status: Within Functional Limits for tasks assessed                                                Home Living Family/patient expects to be discharged to:: Private residence Living Arrangements: Alone Available Help at Discharge: Family;Available PRN/intermittently Type of Home: House       Home Layout: One level     Bathroom Shower/Tub: Occupational psychologist: Standard     Home Equipment: Cane - quad;Grab bars - toilet          Prior Functioning/Environment Level of Independence: Independent with assistive device(s)        Comments: use of SPC        OT Problem List: Decreased strength;Decreased range of motion;Decreased activity tolerance;Impaired balance (sitting and/or standing);Decreased safety awareness;Pain;Decreased cognition;Decreased knowledge of use of DME or AE;Cardiopulmonary status limiting activity      OT Treatment/Interventions: Self-care/ADL training;Therapeutic exercise;Energy conservation;DME and/or AE instruction;Therapeutic activities;Manual therapy;Patient/family education;Balance training    OT Goals(Current goals can be found in the care plan section) Acute Rehab OT Goals Patient Stated Goal: To get better OT Goal Formulation: With patient Time For Goal Achievement: 07/28/20 Potential to Achieve Goals: Good ADL Goals Pt  Will Perform Grooming: with modified independence;standing Pt Will Perform Lower Body Dressing: with modified independence;sit to/from stand Pt Will Transfer to Toilet: with modified independence;ambulating Pt Will Perform Toileting - Clothing Manipulation and hygiene: with modified independence;sit to/from stand  OT Frequency: Min 1X/week   Barriers to D/C: Decreased caregiver support  no one to provide assist at home          AM-PAC OT "6 Clicks" Daily Activity     Outcome Measure Help from another person eating meals?: None Help from another person taking care of personal grooming?: A Little Help from another person toileting, which includes using toliet, bedpan, or urinal?: A Lot Help from another person bathing (including washing, rinsing, drying)?: A Lot Help from another person to put on and taking off regular upper body clothing?: A Little Help from another person to put on and taking off regular lower body clothing?: A Lot 6 Click Score: 16   End of Session Equipment Utilized During Treatment: Rolling walker Nurse Communication: Mobility status;Precautions  Activity Tolerance: Patient tolerated treatment well Patient left: in bed  OT Visit Diagnosis: Unsteadiness on feet (R26.81);Repeated  falls (R29.6);Muscle weakness (generalized) (M62.81)                Time: 7035-0093 OT Time Calculation (min): 45 min Charges:  OT General Charges $OT Visit: 1 Visit OT Evaluation $OT Eval Moderate Complexity: 1 Mod OT Treatments $Self Care/Home Management : 23-37 mins  Darleen Crocker, MS, OTR/L , CBIS ascom [phone]  07/14/20, 4:32 PM

## 2020-07-14 NOTE — ED Notes (Signed)
Orange juice given at this time due to low CBG

## 2020-07-14 NOTE — Progress Notes (Signed)
PROGRESS NOTE    Mariah Forbes  WLN:[id number] DOB: June 28, 1956 DOA: 07/13/2020 PCP: Gladstone Lighter, MD    Brief Narrative:  Mariah Forbes is a 64 year old female with past medical history significant for type 2 diabetes mellitus, essential hypertension, and CKD stage IIIb who presents to ED following fall versus syncopal episode. Patient's son called and noticed she was slightly disoriented, upon finding her at her home she was lying on the floor in which she was alert and awake. Patient was covered in urine and had been on the floor for over 12 hours. No history of seizure or seizure-like activity reported and no trauma to her tongue. Patient complaining of diffuse body pain, headache and neck pain. Patient with cough for about 1 week with decreased exercise tolerance.  Patient was recently seen in the ED 07/12/2020, diagnosed with UTI and prescribed antibiotics in which she has yet to start.  In the ED, temperature 98.7, BP 150/72, HR 72, RR 18,   Assessment & Plan:   Active Problems:   LOC (loss of consciousness) (HCC)   DM2 (diabetes mellitus, type 2) ([place])   Diabetic nephropathy associated with type 2 diabetes mellitus ([place])   Cardiac arrhythmia   GERD (gastroesophageal reflux disease)   COVID-19 virus infection   Cystitis   Acute hypoxic respiratory failure secondary to acute Covid-19 viral pneumonia during the ongoing 2020/2021 Covid 19 Pandemic - POA Patient presenting to ED via EMS after being found on the floor for greater than 12 hours covered in urine. Patient reports progressive cough and exertional dyspnea over the past week. Patient is afebrile without leukocytosis. Covid-19 PCR positive. Influenza A/B PCR negative. Chest x-ray with low lung volumes with bibasilar atelectasis, mild interstitial prominence, bibasilar interstitial edema. Received monoclonal antibody infusion with bamlanivimab/etesevimab on 07/13/2020. --COVID test: + PCR 07/13/2020 --CRP  12.6 --ddimer 1238 --Remdesivir, plan 5-day course (Day #1/5) --Decadron 6mg  PO daily (Day #1/10) --prone for 2-3hrs every 12hrs if able --Continue supplemental oxygen, titrate to maintain SPO2 greater than 92% --Continue supportive care with albuterol MDI prn, vitamin C, zinc, Tylenol, antitussives (benzonatate/ Mucinex/Tussionex) --Follow CBC, CMP, D-dimer, ferritin, and CRP daily --Continue airborne/contact isolation precautions for 3 weeks from the day of diagnosis inpatient (August 04, 2020)  The treatment plan and use of medications and known side effects were discussed with patient/family. Some of the medications used are based on case reports/anecdotal data.  All other medications being used in the management of COVID-19 based on limited study data.  Complete risks and long-term side effects are unknown, however in the best clinical judgment they seem to be of some benefit.  Patient wanted to proceed with treatment options provided.  Syncopal episode Patient presenting from home after being found down by her son for roughly 12 hours. Unclear etiology but suspect volume depletion from dehydration in the setting of acute Covid-19 pneumonia versus untreated UTI versus hypotension from aggressive BP regimen. EEG with no seizure activity/epileptiform discharges. TTE with LVEF 65-70%, no LV regional wall motion abnormalities, trivial MR, aortic valve normal in structure. MR brain with no acute intracranial abnormality, notable for chronic small vessel ischemia and generalized volume loss. --Cardiology following, appreciate assistance --Continue to monitor on telemetry  Elevated troponin Troponin 14, 13, 23. TTE with preserved LVEF with no regional LV wall motion abnormalities. --Cardiology following, appreciate assistance --Not a candidate for functional cardiac study while Covid positive --Continue monitor on telemetry  Urinary tract infection Patient was seen seen in the ED on 07/12/2020  and diagnosed  with UTI, discharged on antibiotics (cefdinir) in which she has yet to start. Urinalysis on admission with negative nitrite, negative leukoesterase, no bacteria and 0-5 WBCs. Urine culture with less than 10K insignificant growth. --Ceftriaxone 1 g IV every 24 hours x3 days  Type 2 diabetes mellitus Hemoglobin A1c 9.8, poorly controlled. Home regimen includes Levemir 65 units subcutaneously daily, NovoLog 15-20 units 3 times daily AC. --Diabetic educator following, appreciate assistance --Levemir 50 units subcutaneously daily --Insulin sliding scale for further coverage --CBGs before every meal/at bedtime  Essential hypertension On losartan 100 mg p.o. daily, metoprolol tartrate 50 mg p.o. twice daily, HCTZ 12.5 mg p.o. daily at home. --BP 129/61 --Continue metoprolol tartrate 50 mg PO BID --Continue to hold home losartan and HCTZ --Continue aspirin and statin --Monitor blood pressure closely daily  CKD stage IIIb Baseline creatinine 1.3-1.4 with a GFR 39. --Creatinine 1.31, at baseline. --Avoid nephrotoxins, renally dose all medications --Follow renal function closely daily  Hyperlipidemia: Continue atorvastatin 40 mg p.o. daily  Peripheral neuropathy: Gabapentin 300 mg p.o. twice daily  Depression/anxiety: Fluoxetine 20 mg p.o. daily  Weakness/debility/deconditioning: --OT recommends SNF placement --Pending PT evaluation --TOC for placement  DVT prophylaxis: Lovenox Code Status: Full code Family Communication: Updated patient extensively at bedside  Disposition Plan:  Status is: Inpatient  Remains inpatient appropriate because:Unsafe d/c plan, IV treatments appropriate due to intensity of illness or inability to take PO and Inpatient level of care appropriate due to severity of illness   Dispo: The patient is from: Home              Anticipated d/c is to: SNF              Anticipated d/c date is: > 3 days              Patient currently is not medically  stable to d/c.  Consultants:   Cardiology, Dr. Ubaldo Glassing  Procedures:   Transthoracic echocardiogram  Antimicrobials:   Ceftriaxone 12/28 - 12/30   Subjective: Patient seen and examined at bedside, resting comfortably. Continues with mild shortness of breath. Also with significant weakness. No other questions or concerns at this time. Denies headache, no fever/chills/night sweats, no nausea/vomiting/diarrhea, no chest pain, no palpitations, no abdominal pain, no paresthesias. No acute events overnight per nursing staff.  Objective: Vitals:   07/14/20 1445 07/14/20 1515 07/14/20 1530 07/14/20 1554  BP:    (!) 141/82  Pulse: 83 81 87 82  Resp: 15 (!) 21 (!) 23 18  Temp:      TempSrc:      SpO2: 96% 97% 94% 93%  Weight:      Height:        Intake/Output Summary (Last 24 hours) at 07/14/2020 1716 Last data filed at 07/14/2020 0737 Gross per 24 hour  Intake --  Output 800 ml  Net -800 ml   Filed Weights   07/14/20 1131  Weight: 89.8 kg    Examination:  General exam: Appears calm and comfortable  Respiratory system: Clear to auscultation. Respiratory effort normal. On 2 L nasal cannula with SPO2 98% Cardiovascular system: S1 & S2 heard, RRR. No JVD, murmurs, rubs, gallops or clicks. No pedal edema. Gastrointestinal system: Abdomen is nondistended, soft and nontender. No organomegaly or masses felt. Normal bowel sounds heard. Central nervous system: Alert and oriented. No focal neurological deficits. Extremities: Symmetric 5 x 5 power. Skin: No rashes, lesions or ulcers Psychiatry: Judgement and insight appear normal. Mood & affect appropriate.  Data Reviewed: I have personally reviewed following labs and imaging studies  CBC: Recent Labs  Lab 07/11/20 1853 07/13/20 0521 07/13/20 1017 07/14/20 0852  WBC 15.4* 7.9 7.0 5.9  NEUTROABS  --  6.2  --   --   HGB 12.4 10.3* 10.9* 10.2*  HCT 38.1 32.0* 35.6* 32.0*  MCV 87.2 88.4 93.9 88.4  PLT 189 137* 154 139*    Basic Metabolic Panel: Recent Labs  Lab 07/11/20 1853 07/13/20 0521 07/13/20 1017 07/14/20 0852  NA 136 136  --  133*  K 3.8 3.7  --  3.6  CL 101 102  --  100  CO2 27 23  --  25  GLUCOSE 225* 231*  --  86  BUN 20 23  --  21  CREATININE 1.22* 1.17* 1.22* 1.31*  CALCIUM 8.8* 8.2*  --  8.0*   GFR: Estimated Creatinine Clearance: 45.6 mL/min (A) (by C-G formula based on SCr of 1.31 mg/dL (H)). Liver Function Tests: Recent Labs  Lab 07/13/20 0521 07/14/20 0852  AST 20 22  ALT 13 13  ALKPHOS 62 60  BILITOT 0.8 0.7  PROT 6.5 6.4*  ALBUMIN 2.8* 2.6*   No results for input(s): LIPASE, AMYLASE in the last 168 hours. No results for input(s): AMMONIA in the last 168 hours. Coagulation Profile: No results for input(s): INR, PROTIME in the last 168 hours. Cardiac Enzymes: Recent Labs  Lab 07/13/20 0521  CKTOTAL 285*   BNP (last 3 results) No results for input(s): PROBNP in the last 8760 hours. HbA1C: Recent Labs    07/13/20 1017  HGBA1C 9.8*   CBG: Recent Labs  Lab 07/13/20 0551 07/13/20 1244 07/14/20 0814 07/14/20 1204  GLUCAP 228* 210* 67* 100*   Lipid Profile: No results for input(s): CHOL, HDL, LDLCALC, TRIG, CHOLHDL, LDLDIRECT in the last 72 hours. Thyroid Function Tests: Recent Labs    07/13/20 1017  TSH 1.743   Anemia Panel: No results for input(s): VITAMINB12, FOLATE, FERRITIN, TIBC, IRON, RETICCTPCT in the last 72 hours. Sepsis Labs: Recent Labs  Lab 07/13/20 0521  PROCALCITON <0.10    Recent Results (from the past 240 hour(s))  Resp Panel by RT-PCR (Flu A&B, Covid) Nasopharyngeal Swab     Status: Abnormal   Collection Time: 07/13/20  5:54 AM   Specimen: Nasopharyngeal Swab; Nasopharyngeal(NP) swabs in vial transport medium  Result Value Ref Range Status   SARS Coronavirus 2 by RT PCR POSITIVE (A) NEGATIVE Final    Comment: RESULT CALLED TO, READ BACK BY AND VERIFIED WITH: GRACIE [facility] ON 07/13/20 AT 4098 QSD (NOTE) SARS-CoV-2 target  nucleic acids are DETECTED.  The SARS-CoV-2 RNA is generally detectable in upper respiratory specimens during the acute phase of infection. Positive results are indicative of the presence of the identified virus, but do not rule out bacterial infection or co-infection with other pathogens not detected by the test. Clinical correlation with patient history and other diagnostic information is necessary to determine patient infection status. The expected result is Negative.  Fact Sheet for Patients: EntrepreneurPulse.com.au  Fact Sheet for Healthcare Providers: IncredibleEmployment.be  This test is not yet approved or cleared by the [place] FDA and  has been authorized for detection and/or diagnosis of SARS-CoV-2 by FDA under an Emergency Use Authorization (EUA).  This EUA will remain in effect (meaning this test c an be used) for the duration of  the COVID-19 declaration under Section 564(b)(1) of the Act, 21 U.S.C. section 360bbb-3(b)(1), unless the authorization is terminated or revoked  sooner.     Influenza A by PCR NEGATIVE NEGATIVE Final   Influenza B by PCR NEGATIVE NEGATIVE Final    Comment: (NOTE) The Xpert Xpress SARS-CoV-2/FLU/RSV plus assay is intended as an aid in the diagnosis of influenza from Nasopharyngeal swab specimens and should not be used as a sole basis for treatment. Nasal washings and aspirates are unacceptable for Xpert Xpress SARS-CoV-2/FLU/RSV testing.  Fact Sheet for Patients: EntrepreneurPulse.com.au  Fact Sheet for Healthcare Providers: IncredibleEmployment.be  This test is not yet approved or cleared by the [place] FDA and has been authorized for detection and/or diagnosis of SARS-CoV-2 by FDA under an Emergency Use Authorization (EUA). This EUA will remain in effect (meaning this test can be used) for the duration of the COVID-19 declaration under Section  564(b)(1) of the Act, 21 U.S.C. section 360bbb-3(b)(1), unless the authorization is terminated or revoked.  Performed at [facility], [address], [place], [place] [address]   Urine culture     Status: Abnormal   Collection Time: 07/13/20 12:50 PM   Specimen: Urine, Random  Result Value Ref Range Status   Specimen Description   Final    URINE, RANDOM Performed at [facility], [address] [place], [place] [address]    Special Requests   Final    NONE Performed at [facility], [address] [place], [place] [address]    Culture (A)  Final    <10,000 COLONIES/mL INSIGNIFICANT GROWTH Performed at [facility], [place] [address], [place], [place] [address]    Report Status 07/14/2020 FINAL  Final         Radiology Studies: EEG  Result Date: 07/13/2020 Lora Havens, MD     07/13/2020  4:47 PM Patient Name: Mariah Forbes MRN: [id number] Epilepsy Attending: Lora Havens Referring Physician/Provider: Dr Jeneen Rinks Date: 07/13/2020 Duration: 21.23 mins Patient history: 63yo F with prior CVA, now with LOC. EEG to evaluate for seizure Level of alertness: Awake, asleep AEDs during EEG study: GBP Technical aspects: This EEG study was done with scalp electrodes positioned according to the 10-20 International system of electrode placement. Electrical activity was acquired at a sampling rate of 500Hz  and reviewed with a high frequency filter of 70Hz  and a low frequency filter of 1Hz . EEG data were recorded continuously and digitally stored. Description: The posterior dominant rhythm consists of 8-9 Hz activity of moderate voltage (25-35 uV) seen predominantly in posterior head regions, symmetric and reactive to eye opening and eye closing. Sleep was characterized by vertex waves, sleep spindles (12 to 14 Hz), maximal frontocentral region.  Hyperventilation did not show any EEG change. Physiologic photic driving was not seen during photic  stimulation.  Hyperventilation was not performed.   IMPRESSION: This study is within normal limits. No seizures or epileptiform discharges were seen throughout the recording. Lora Havens   DG Chest 2 View  Result Date: 07/13/2020 CLINICAL DATA:  Fall. EXAM: CHEST - 2 VIEW COMPARISON:  10/15/2019. FINDINGS: Mediastinum and hilar structures normal. Heart size normal. No pulmonary venous congestion. Low lung volumes with bibasilar atelectasis. Mild bibasilar interstitial prominence. Bibasilar interstitial edema and or pneumonitis cannot be excluded. No pleural effusion or pneumothorax. IMPRESSION: Low lung volumes with bibasilar atelectasis. Mild bibasilar interstitial prominence. Bibasilar interstitial edema and or pneumonitis cannot be excluded. Electronically Signed   By: Marcello Moores  Register   On: 07/13/2020 06:34   CT Head Wo Contrast  Result Date: 07/13/2020 CLINICAL DATA:  Unwitnessed fall, altered  mental status, head injury EXAM: CT HEAD WITHOUT CONTRAST CT CERVICAL SPINE WITHOUT CONTRAST TECHNIQUE: Multidetector CT imaging of the head and cervical spine was performed following the standard protocol without intravenous contrast. Multiplanar CT image reconstructions of the cervical spine were also generated. COMPARISON:  None. FINDINGS: CT HEAD FINDINGS Brain: Normal anatomic configuration. Parenchymal volume loss is commensurate with the patient's age. Mild periventricular white matter changes are present likely reflecting the sequela of small vessel ischemia. No abnormal intra or extra-axial mass lesion or fluid collection. No abnormal mass effect or midline shift. No evidence of acute intracranial hemorrhage or infarct. Ventricular size is normal. Cerebellum unremarkable. Vascular: No asymmetric hyperdense vasculature at the skull base. Skull: Intact Sinuses/Orbits: Moderate mucosal thickening is noted within the a maxillary sinuses bilaterally and ethmoid air cells bilaterally. No air-fluid  levels. The frontal sinuses are hypoplastic. Other: Mastoid air cells and middle ear cavities are clear. CT CERVICAL SPINE FINDINGS Alignment: Normal cervical lordosis.  No listhesis. Skull base and vertebrae: The craniocervical junction is unremarkable. The atlantodental interval is normal. No acute fracture of the cervical spine. Soft tissues and spinal canal: No prevertebral fluid or swelling. No visible canal hematoma. Disc levels: Review of the sagittal images demonstrates preservation of vertebral body height. There is mild intervertebral disc space narrowing and endplate remodeling of a C4-C7 in keeping with changes of mild degenerative disc disease. Spinal canal is widely patent. Prevertebral soft tissues are not thickened. Review of the axial images demonstrates multilevel uncovertebral and facet arthrosis without significant associated neural foraminal narrowing. Upper chest: Unremarkable Other: None IMPRESSION: No acute intracranial abnormality.  No calvarial fracture. Moderate paranasal sinus disease. No acute fracture or listhesis of the cervical spine. Electronically Signed   By: Fidela Salisbury MD   On: 07/13/2020 06:39   CT Cervical Spine Wo Contrast  Result Date: 07/13/2020 CLINICAL DATA:  Unwitnessed fall, altered mental status, head injury EXAM: CT HEAD WITHOUT CONTRAST CT CERVICAL SPINE WITHOUT CONTRAST TECHNIQUE: Multidetector CT imaging of the head and cervical spine was performed following the standard protocol without intravenous contrast. Multiplanar CT image reconstructions of the cervical spine were also generated. COMPARISON:  None. FINDINGS: CT HEAD FINDINGS Brain: Normal anatomic configuration. Parenchymal volume loss is commensurate with the patient's age. Mild periventricular white matter changes are present likely reflecting the sequela of small vessel ischemia. No abnormal intra or extra-axial mass lesion or fluid collection. No abnormal mass effect or midline shift. No evidence  of acute intracranial hemorrhage or infarct. Ventricular size is normal. Cerebellum unremarkable. Vascular: No asymmetric hyperdense vasculature at the skull base. Skull: Intact Sinuses/Orbits: Moderate mucosal thickening is noted within the a maxillary sinuses bilaterally and ethmoid air cells bilaterally. No air-fluid levels. The frontal sinuses are hypoplastic. Other: Mastoid air cells and middle ear cavities are clear. CT CERVICAL SPINE FINDINGS Alignment: Normal cervical lordosis.  No listhesis. Skull base and vertebrae: The craniocervical junction is unremarkable. The atlantodental interval is normal. No acute fracture of the cervical spine. Soft tissues and spinal canal: No prevertebral fluid or swelling. No visible canal hematoma. Disc levels: Review of the sagittal images demonstrates preservation of vertebral body height. There is mild intervertebral disc space narrowing and endplate remodeling of a C4-C7 in keeping with changes of mild degenerative disc disease. Spinal canal is widely patent. Prevertebral soft tissues are not thickened. Review of the axial images demonstrates multilevel uncovertebral and facet arthrosis without significant associated neural foraminal narrowing. Upper chest: Unremarkable Other: None IMPRESSION: No acute intracranial abnormality.  No calvarial fracture. Moderate paranasal sinus disease. No acute fracture or listhesis of the cervical spine. Electronically Signed   By: Fidela Salisbury MD   On: 07/13/2020 06:39   MR BRAIN WO CONTRAST  Result Date: 07/13/2020 CLINICAL DATA:  Syncope EXAM: MRI HEAD WITHOUT CONTRAST TECHNIQUE: Multiplanar, multiecho pulse sequences of the brain and surrounding structures were obtained without intravenous contrast. COMPARISON:  None. FINDINGS: Brain: No acute infarct, mass effect or extra-axial collection. No acute or chronic hemorrhage. There is multifocal hyperintense T2-weighted signal within the white matter. Generalized volume loss  without a clear lobar predilection. The midline structures are normal. The hippocampi are normal and symmetric in size and signal. The hypothalamus and mamillary bodies are normal. There is no cortical ectopia or dysplasia. Vascular: Major flow voids are preserved. Skull and upper cervical spine: Normal calvarium and skull base. Visualized upper cervical spine and soft tissues are normal. Sinuses/Orbits:Mild ethmoid and maxillary sinus mucosal thickening. Normal orbits. IMPRESSION: 1. No acute intracranial abnormality. 2. Findings of chronic small vessel ischemia and generalized volume loss. Electronically Signed   By: Ulyses Jarred M.D.   On: 07/13/2020 23:17   ECHOCARDIOGRAM COMPLETE  Result Date: 07/14/2020    ECHOCARDIOGRAM REPORT   Patient Name:   Mariah Forbes Date of Exam: 07/14/2020 Medical Rec #:  [id number]       Height:       63.0 in Accession #:    [id number]      Weight:       198.0 lb Date of Birth:  08-Sep-1955       BSA:          1.925 m Patient Age:    63 years        BP:           164/97 mmHg Patient Gender: F               HR:           84 bpm. Exam Location:  [facility] Procedure: 2D Echo, Cardiac Doppler and Color Doppler Indications:     Chest pain 786.50  History:         Patient has no prior history of Echocardiogram examinations.                  Risk Factors:Hypertension and Diabetes.  Sonographer:     Sherrie Sport RDCS (AE) Referring Phys:  [place] Diagnosing Phys: Bartholome Bill MD  Sonographer Comments: Suboptimal apical window. IMPRESSIONS  1. Left ventricular ejection fraction, by estimation, is 65 to 70%. The left ventricle has normal function. The left ventricle has no regional wall motion abnormalities. Left ventricular diastolic parameters were normal.  2. Right ventricular systolic function is normal. The right ventricular size is normal.  3. The mitral valve is grossly normal. Trivial mitral valve regurgitation.  4. The aortic valve is normal in structure. Aortic valve  regurgitation is not visualized. FINDINGS  Left Ventricle: Left ventricular ejection fraction, by estimation, is 65 to 70%. The left ventricle has normal function. The left ventricle has no regional wall motion abnormalities. The left ventricular internal cavity size was normal in size. There is  no left ventricular hypertrophy. Left ventricular diastolic parameters were normal. Right Ventricle: The right ventricular size is normal. No increase in right ventricular wall thickness. Right ventricular systolic function is normal. Left Atrium: Left atrial size was normal in size. Right Atrium: Right atrial size was normal in size. Pericardium: There is no evidence  of pericardial effusion. Mitral Valve: The mitral valve is grossly normal. Trivial mitral valve regurgitation. Tricuspid Valve: The tricuspid valve is not well visualized. Tricuspid valve regurgitation is not demonstrated. Aortic Valve: The aortic valve is normal in structure. Aortic valve regurgitation is not visualized. Aortic valve mean gradient measures 6.0 mmHg. Aortic valve peak gradient measures 9.6 mmHg. Aortic valve area, by VTI measures 1.99 cm. Pulmonic Valve: The pulmonic valve was not well visualized. Pulmonic valve regurgitation is trivial. Aorta: The aortic root is normal in size and structure. IAS/Shunts: The interatrial septum was not assessed.  LEFT VENTRICLE PLAX 2D LVIDd:         4.36 cm  Diastology LVIDs:         2.27 cm  LV e' medial:    7.18 cm/s LV PW:         1.05 cm  LV E/e' medial:  14.9 LV IVS:        1.00 cm  LV e' lateral:   10.70 cm/s LVOT diam:     2.10 cm  LV E/e' lateral: 10.0 LV SV:         60 LV SV Index:   31 LVOT Area:     3.46 cm  RIGHT VENTRICLE RV Basal diam:  3.33 cm RV S prime:     17.70 cm/s TAPSE (M-mode): 4.3 cm LEFT ATRIUM             Index       RIGHT ATRIUM           Index LA diam:        3.20 cm 1.66 cm/m  RA Area:     21.80 cm LA Vol (A2C):   48.4 ml 25.14 ml/m RA Volume:   62.10 ml  32.25 ml/m LA Vol  (A4C):   60.6 ml 31.47 ml/m LA Biplane Vol: 54.5 ml 28.31 ml/m  AORTIC VALVE                    PULMONIC VALVE AV Area (Vmax):    1.90 cm     PV Vmax:       0.61 m/s AV Area (Vmean):   1.66 cm     PV Peak grad:  1.5 mmHg AV Area (VTI):     1.99 cm AV Vmax:           155.00 cm/s AV Vmean:          114.667 cm/s AV VTI:            0.299 m AV Peak Grad:      9.6 mmHg AV Mean Grad:      6.0 mmHg LVOT Vmax:         84.90 cm/s LVOT Vmean:        55.100 cm/s LVOT VTI:          0.172 m LVOT/AV VTI ratio: 0.57  AORTA Ao Root diam: 3.50 cm MITRAL VALVE                TRICUSPID VALVE MV Area (PHT): 4.15 cm     TR Peak grad:   11.7 mmHg MV Decel Time: 183 msec     TR Vmax:        171.00 cm/s MV E velocity: 107.00 cm/s MV A velocity: 97.30 cm/s   SHUNTS MV E/A ratio:  1.10         Systemic VTI:  0.17 m  Systemic Diam: 2.10 cm Bartholome Bill MD Electronically signed by Bartholome Bill MD Signature Date/Time: 07/14/2020/12:18:13 PM    Final         Scheduled Meds: . vitamin C  500 mg Oral Daily  . aspirin EC  81 mg Oral Daily  . atorvastatin  40 mg Oral Daily  . dexamethasone  6 mg Oral Q24H  . enoxaparin (LOVENOX) injection  0.5 mg/kg Subcutaneous Q24H  . fesoterodine  8 mg Oral Daily  . FLUoxetine  20 mg Oral Daily  . gabapentin  300 mg Oral BID  . insulin aspart  0-15 Units Subcutaneous TID WC  . insulin aspart  0-5 Units Subcutaneous QHS  . insulin detemir  50 Units Subcutaneous QHS  . Ipratropium-Albuterol  1 puff Inhalation Q6H  . metoCLOPramide  5 mg Oral BID  . metoprolol tartrate  50 mg Oral BID  . multivitamin with minerals  1 tablet Oral Daily  . pantoprazole  40 mg Oral BID  . senna  1 tablet Oral BID  . zinc sulfate  220 mg Oral Daily   Continuous Infusions: . sodium chloride 75 mL/hr at 07/14/20 1200  . sodium chloride    . cefTRIAXone (ROCEPHIN)  IV Stopped (07/14/20 1004)  . famotidine (PEPCID) IV    . [START ON 07/15/2020] remdesivir 100 mg in NS 100 mL        LOS: 1 day    Time spent: 41 minutes spent on chart review, discussion with nursing staff, consultants, updating family and interview/physical exam; more than 50% of that time was spent in counseling and/or coordination of care.    Caitlyne Ingham J [place] DO [facility] Available via Epic secure chat 7am-7pm After these hours, please refer to coverage provider listed on [email or web address] 07/14/2020, 5:16 PM

## 2020-07-14 NOTE — ED Notes (Signed)
Echo at bedside

## 2020-07-14 NOTE — ED Notes (Signed)
Pt cleaned after incontinence episode. Pt linens changed, pt changed into clean brief and clean chux and purewick placed at this time. Warm blanket given and pt scooted up in bed at this time

## 2020-07-14 NOTE — ED Notes (Signed)
Pt given meal tray at this time 

## 2020-07-14 NOTE — Progress Notes (Signed)
Inpatient Diabetes Program Recommendations  AACE/ADA: New Consensus Statement on Inpatient Glycemic Control  Target Ranges:  Prepandial:   less than 140 mg/dL      Peak postprandial:   less than 180 mg/dL (1-2 hours)      Critically ill patients:  140 - 180 mg/dL  Results for Mariah Forbes, Mariah Forbes (MRN [id number]) as of 07/14/2020 09:57  Ref. Range 07/13/2020 05:51 07/13/2020 12:44 07/13/2020 22:28 07/14/2020 08:14  Glucose-Capillary Latest Ref Range: 70 - 99 mg/dL 228 (H) 210 (H)    Levemir 15 units @12 :46     Levemir 65 units 67 (L)    Review of Glycemic Control  Diabetes history: DM2 Outpatient Diabetes medications: Levemir 15 units QAM, Levemir 65 units QHS, Novolog 15-20 units TID with meals Current orders for Inpatient glycemic control: Novolog 0-15 units TID with meals, Novolog 0-5 units QHS; Decadron 6 mg Q24H  Inpatient Diabetes Program Recommendations:    Insulin: Noted patient received a total of Levemir 80 units on 07/13/20 and fasting glucose 67 mg/dl this morning. Levemir has been discontinued and noted Decadron has been ordered today.  Please consider ordering Levemir 60 units QHS.   Thanks, Barnie Alderman, RN, MSN, CDE Diabetes Coordinator Inpatient Diabetes Program [phone] (Team Pager from 8am to 5pm)

## 2020-07-14 NOTE — ED Notes (Signed)
Pt cleaned after large BM. Pt given clean chux, brief and clean purewick placed at this time. Pt pulled up in bed.

## 2020-07-14 NOTE — Progress Notes (Signed)
Patient Name: Mariah Forbes Date of Encounter: 07/14/2020  Hospital Problem List     Active Problems:   LOC (loss of consciousness) ([place])   DM2 (diabetes mellitus, type 2) ([place])   Diabetic nephropathy associated with type 2 diabetes mellitus ([place])   Cardiac arrhythmia   GERD (gastroesophageal reflux disease)   COVID-19 virus infection   Cystitis    Patient Profile     64 y.o. female with history of diabetes, hypertension, renal disease who was brought to the emergency room after being found down by her family at her place of residence.  She was seen in the ER several days previously with a probable UTI.  Patient does not have any idea why she had fallen.  She had difficulty getting up from her recliner and feels like she fell.  She regained consciousness at 3 AM.  She apparently was on the floor for approximately 12 hours.  She was covered in urine.  She has a history CVA, chronic kidney disease, hyperlipidemia and hypertension.  She had cold-like symptoms earlier in the week.  EKG shows sinus rhythm with no ischemia.  EKG shows a chronic kidney disease with a creatinine of 1.17.  Electrolytes were otherwise normal.  High-sensitivity troponin was normal.  Chest x-ray suggested mild bibasilar interstitial prominence.  No obvious pulmonary congestion.  Brain CT showed no acute abnormality.  Cervical spine was cleared.  Patient states she quit smoking smokes marijuana every week.  Currently uses ethanol.  Denies chest pain.  Medications prior to admission Omnicef for her recent UTI.  Occasional Zofran.  She is Covid positive.  Subjective   No further syncopal episodes.  Complains of mild headache.  Review of telemetry reveals sinus rhythm.  Inpatient Medications    . vitamin C  500 mg Oral Daily  . aspirin EC  81 mg Oral Daily  . atorvastatin  40 mg Oral Daily  . dexamethasone  6 mg Oral Q24H  . enoxaparin (LOVENOX) injection  0.5 mg/kg Subcutaneous Q24H  . fesoterodine  8 mg Oral  Daily  . FLUoxetine  20 mg Oral Daily  . gabapentin  300 mg Oral BID  . insulin aspart  0-15 Units Subcutaneous TID WC  . insulin aspart  0-5 Units Subcutaneous QHS  . Ipratropium-Albuterol  1 puff Inhalation Q6H  . metoCLOPramide  5 mg Oral BID  . metoprolol tartrate  50 mg Oral BID  . multivitamin with minerals  1 tablet Oral Daily  . pantoprazole  40 mg Oral BID  . senna  1 tablet Oral BID  . zinc sulfate  220 mg Oral Daily    Vital Signs    Vitals:   07/14/20 0430 07/14/20 0500 07/14/20 0530 07/14/20 0800  BP: 132/65 (!) 147/60 129/61 140/61  Pulse: 83 84 85 84  Resp: (!) 24 (!) 23 (!) 25 14  Temp:      TempSrc:      SpO2: 99% 97% 98% 100%    Intake/Output Summary (Last 24 hours) at 07/14/2020 0851 Last data filed at 07/14/2020 0737 Gross per 24 hour  Intake 1240 ml  Output 800 ml  Net 440 ml   There were no vitals filed for this visit.  Physical Exam    GEN: Well nourished, well developed, in no acute distress.  HEENT: normal.  Neck: Supple, no JVD, carotid bruits, or masses. Cardiac: RRR, no murmurs, rubs, or gallops. No clubbing, cyanosis, edema.  Radials/DP/PT 2+ and equal bilaterally.  Respiratory:  Respirations regular  and unlabored, clear to auscultation bilaterally. GI: Soft, nontender, nondistended, BS + x 4. MS: no deformity or atrophy. Skin: warm and dry, no rash. Neuro:  Strength and sensation are intact. Psych: Normal affect.  Labs    CBC Recent Labs    07/13/20 0521 07/13/20 1017  WBC 7.9 7.0  NEUTROABS 6.2  --   HGB 10.3* 10.9*  HCT 32.0* 35.6*  MCV 88.4 93.9  PLT 137* 998   Basic Metabolic Panel Recent Labs    07/11/20 1853 07/13/20 0521 07/13/20 1017  NA 136 136  --   K 3.8 3.7  --   CL 101 102  --   CO2 27 23  --   GLUCOSE 225* 231*  --   BUN 20 23  --   CREATININE 1.22* 1.17* 1.22*  CALCIUM 8.8* 8.2*  --    Liver Function Tests Recent Labs    07/13/20 0521  AST 20  ALT 13  ALKPHOS 62  BILITOT 0.8  PROT 6.5   ALBUMIN 2.8*   No results for input(s): LIPASE, AMYLASE in the last 72 hours. Cardiac Enzymes Recent Labs    07/13/20 0521  CKTOTAL 285*   BNP No results for input(s): BNP in the last 72 hours. D-Dimer No results for input(s): DDIMER in the last 72 hours. Hemoglobin A1C Recent Labs    07/13/20 1017  HGBA1C 9.8*   Fasting Lipid Panel No results for input(s): CHOL, HDL, LDLCALC, TRIG, CHOLHDL, LDLDIRECT in the last 72 hours. Thyroid Function Tests Recent Labs    07/13/20 1017  TSH 1.743    Telemetry    Sinus rhythm with no significant arrhythmia or pauses.  ECG    Normal sinus rhythm  Radiology    EEG  Result Date: 07/13/2020 Mariah Havens, MD     07/13/2020  4:47 PM Patient Name: Mariah Forbes MRN: [id number] Epilepsy Attending: Lora Forbes Referring Physician/Provider: Dr Jeneen Rinks Date: 07/13/2020 Duration: 21.23 mins Patient history: 64yo F with prior CVA, now with LOC. EEG to evaluate for seizure Level of alertness: Awake, asleep AEDs during EEG study: GBP Technical aspects: This EEG study was done with scalp electrodes positioned according to the 10-20 International system of electrode placement. Electrical activity was acquired at a sampling rate of 500Hz  and reviewed with a high frequency filter of 70Hz  and a low frequency filter of 1Hz . EEG data were recorded continuously and digitally stored. Description: The posterior dominant rhythm consists of 8-9 Hz activity of moderate voltage (25-35 uV) seen predominantly in posterior head regions, symmetric and reactive to eye opening and eye closing. Sleep was characterized by vertex waves, sleep spindles (12 to 14 Hz), maximal frontocentral region.  Hyperventilation did not show any EEG change. Physiologic photic driving was not seen during photic stimulation.  Hyperventilation was not performed.   IMPRESSION: This study is within normal limits. No seizures or epileptiform discharges were seen throughout the  recording. Mariah Forbes   DG Chest 2 View  Result Date: 07/13/2020 CLINICAL DATA:  Fall. EXAM: CHEST - 2 VIEW COMPARISON:  10/15/2019. FINDINGS: Mediastinum and hilar structures normal. Heart size normal. No pulmonary venous congestion. Low lung volumes with bibasilar atelectasis. Mild bibasilar interstitial prominence. Bibasilar interstitial edema and or pneumonitis cannot be excluded. No pleural effusion or pneumothorax. IMPRESSION: Low lung volumes with bibasilar atelectasis. Mild bibasilar interstitial prominence. Bibasilar interstitial edema and or pneumonitis cannot be excluded. Electronically Signed   By: Marcello Moores  Register   On: 07/13/2020 06:34  CT Head Wo Contrast  Result Date: 07/13/2020 CLINICAL DATA:  Unwitnessed fall, altered mental status, head injury EXAM: CT HEAD WITHOUT CONTRAST CT CERVICAL SPINE WITHOUT CONTRAST TECHNIQUE: Multidetector CT imaging of the head and cervical spine was performed following the standard protocol without intravenous contrast. Multiplanar CT image reconstructions of the cervical spine were also generated. COMPARISON:  None. FINDINGS: CT HEAD FINDINGS Brain: Normal anatomic configuration. Parenchymal volume loss is commensurate with the patient's age. Mild periventricular white matter changes are present likely reflecting the sequela of small vessel ischemia. No abnormal intra or extra-axial mass lesion or fluid collection. No abnormal mass effect or midline shift. No evidence of acute intracranial hemorrhage or infarct. Ventricular size is normal. Cerebellum unremarkable. Vascular: No asymmetric hyperdense vasculature at the skull base. Skull: Intact Sinuses/Orbits: Moderate mucosal thickening is noted within the a maxillary sinuses bilaterally and ethmoid air cells bilaterally. No air-fluid levels. The frontal sinuses are hypoplastic. Other: Mastoid air cells and middle ear cavities are clear. CT CERVICAL SPINE FINDINGS Alignment: Normal cervical lordosis.   No listhesis. Skull base and vertebrae: The craniocervical junction is unremarkable. The atlantodental interval is normal. No acute fracture of the cervical spine. Soft tissues and spinal canal: No prevertebral fluid or swelling. No visible canal hematoma. Disc levels: Review of the sagittal images demonstrates preservation of vertebral body height. There is mild intervertebral disc space narrowing and endplate remodeling of a C4-C7 in keeping with changes of mild degenerative disc disease. Spinal canal is widely patent. Prevertebral soft tissues are not thickened. Review of the axial images demonstrates multilevel uncovertebral and facet arthrosis without significant associated neural foraminal narrowing. Upper chest: Unremarkable Other: None IMPRESSION: No acute intracranial abnormality.  No calvarial fracture. Moderate paranasal sinus disease. No acute fracture or listhesis of the cervical spine. Electronically Signed   By: Fidela Salisbury MD   On: 07/13/2020 06:39   CT Cervical Spine Wo Contrast  Result Date: 07/13/2020 CLINICAL DATA:  Unwitnessed fall, altered mental status, head injury EXAM: CT HEAD WITHOUT CONTRAST CT CERVICAL SPINE WITHOUT CONTRAST TECHNIQUE: Multidetector CT imaging of the head and cervical spine was performed following the standard protocol without intravenous contrast. Multiplanar CT image reconstructions of the cervical spine were also generated. COMPARISON:  None. FINDINGS: CT HEAD FINDINGS Brain: Normal anatomic configuration. Parenchymal volume loss is commensurate with the patient's age. Mild periventricular white matter changes are present likely reflecting the sequela of small vessel ischemia. No abnormal intra or extra-axial mass lesion or fluid collection. No abnormal mass effect or midline shift. No evidence of acute intracranial hemorrhage or infarct. Ventricular size is normal. Cerebellum unremarkable. Vascular: No asymmetric hyperdense vasculature at the skull base.  Skull: Intact Sinuses/Orbits: Moderate mucosal thickening is noted within the a maxillary sinuses bilaterally and ethmoid air cells bilaterally. No air-fluid levels. The frontal sinuses are hypoplastic. Other: Mastoid air cells and middle ear cavities are clear. CT CERVICAL SPINE FINDINGS Alignment: Normal cervical lordosis.  No listhesis. Skull base and vertebrae: The craniocervical junction is unremarkable. The atlantodental interval is normal. No acute fracture of the cervical spine. Soft tissues and spinal canal: No prevertebral fluid or swelling. No visible canal hematoma. Disc levels: Review of the sagittal images demonstrates preservation of vertebral body height. There is mild intervertebral disc space narrowing and endplate remodeling of a C4-C7 in keeping with changes of mild degenerative disc disease. Spinal canal is widely patent. Prevertebral soft tissues are not thickened. Review of the axial images demonstrates multilevel uncovertebral and facet arthrosis without significant  associated neural foraminal narrowing. Upper chest: Unremarkable Other: None IMPRESSION: No acute intracranial abnormality.  No calvarial fracture. Moderate paranasal sinus disease. No acute fracture or listhesis of the cervical spine. Electronically Signed   By: Fidela Salisbury MD   On: 07/13/2020 06:39   MR BRAIN WO CONTRAST  Result Date: 07/13/2020 CLINICAL DATA:  Syncope EXAM: MRI HEAD WITHOUT CONTRAST TECHNIQUE: Multiplanar, multiecho pulse sequences of the brain and surrounding structures were obtained without intravenous contrast. COMPARISON:  None. FINDINGS: Brain: No acute infarct, mass effect or extra-axial collection. No acute or chronic hemorrhage. There is multifocal hyperintense T2-weighted signal within the white matter. Generalized volume loss without a clear lobar predilection. The midline structures are normal. The hippocampi are normal and symmetric in size and signal. The hypothalamus and mamillary bodies  are normal. There is no cortical ectopia or dysplasia. Vascular: Major flow voids are preserved. Skull and upper cervical spine: Normal calvarium and skull base. Visualized upper cervical spine and soft tissues are normal. Sinuses/Orbits:Mild ethmoid and maxillary sinus mucosal thickening. Normal orbits. IMPRESSION: 1. No acute intracranial abnormality. 2. Findings of chronic small vessel ischemia and generalized volume loss. Electronically Signed   By: Ulyses Jarred M.D.   On: 07/13/2020 23:17   CT Renal Stone Study  Result Date: 07/11/2020 CLINICAL DATA:  Left-sided flank pain. EXAM: CT ABDOMEN AND PELVIS WITHOUT CONTRAST TECHNIQUE: Multidetector CT imaging of the abdomen and pelvis was performed following the standard protocol without IV contrast. COMPARISON:  None. FINDINGS: Lower chest: There are a few hazy airspace opacities in the left lower lobe. There is a trace right-sided pleural effusion. The heart size is normal. Hepatobiliary: The liver is normal. Cholelithiasis without acute inflammation.There is no biliary ductal dilation. Pancreas: Normal contours without ductal dilatation. No peripancreatic fluid collection. Spleen: There are multiple calcifications throughout the patient's spleen. Adrenals/Urinary Tract: --Adrenal glands: Unremarkable. --Right kidney/ureter: No hydronephrosis or radiopaque kidney stones. --Left kidney/ureter: No hydronephrosis or radiopaque kidney stones. --Urinary bladder: There is gas within the urinary bladder. Stomach/Bowel: --Stomach/Duodenum: No hiatal hernia or other gastric abnormality. Normal duodenal course and caliber. --Small bowel: Unremarkable. --Colon: There is an above average amount of stool throughout the colon. --Appendix: Not visualized. No right lower quadrant inflammation or free fluid. Vascular/Lymphatic: Atherosclerotic calcification is present within the non-aneurysmal abdominal aorta, without hemodynamically significant stenosis. --No retroperitoneal  lymphadenopathy. --No mesenteric lymphadenopathy. --No pelvic or inguinal lymphadenopathy. Reproductive: Status post hysterectomy. No adnexal mass. Other: No ascites or free air. The abdominal wall is normal. Musculoskeletal. No acute displaced fractures. IMPRESSION: 1. No acute abdominopelvic abnormality. 2. There are a few hazy airspace opacities in the left lower lobe, concerning for pneumonia or aspiration. There is a trace right-sided pleural effusion. 3. There is gas within the urinary bladder. Correlate for recent instrumentation. Findings can also be seen in cystitis caused by gas-forming organism. 4. Cholelithiasis without acute inflammation. Aortic Atherosclerosis (ICD10-I70.0). Electronically Signed   By: Constance Holster M.D.   On: 07/11/2020 22:20    Assessment & Plan    64 y.o.femalewith history ofdiabetes, hypertension, renal disease who was brought to the emergency room after being found down by her family at her place of residence. She was seen in the ER several days previously with a probable UTI. Patient does not have any idea why she had fallen. She had difficulty getting up from her recliner and feels like she fell. She regained consciousness at 3 AM. She apparently was on the floor for approximately 12 hours. She was covered  in urine. She has a history CVA, chronic kidney disease, hyperlipidemia and hypertension. She had cold-like symptoms earlier in the week. EKG shows sinus rhythm with no ischemia. EKG shows a chronic kidney disease with a creatinine of 1.17. Electrolytes were otherwise normal. High-sensitivity troponin was normal. Chest x-ray suggested mild bibasilar interstitial prominence. No obvious pulmonary congestion. Brain CT showed no acute abnormality. Cervical spine was cleared. Patient states she quit smoking smokes marijuana every week. Currently uses ethanol. Denies chest pain. Medications prior to admission Omnicef for her recent UTI. Occasional  Zofran. She is Covid positive.  1.  Loss of consciousness-etiology is unclear.  Patient recalls getting up from a chair and felt like she may have fallen.  It is unclear whether she had syncope or not.  She denied any lightheadedness or dizziness per her report.  She has had chest discomfort and palpitations in the past.  She denies any recent chest pain.  There have been no arrhythmias on her monitor thus far for the past 24 hours.  Her renal function appears stable.  Her electrolytes are normal.  She does not appear to be in pulmonary edema.  We will continue to follow on telemetry for evidence of arrhythmia.  Will review echocardiogram when available .  We will continue to treat for Covid.  Not a candidate for functional study while Covid positive.  Does not appear to be ischemic.  2.  History of CVA-no evidence of progression of her neurologic deficits.  No acute abnormality on head CT.  Patient was incontinent of urine however unclear whether she had seizure activity or not.  No obvious seizure activity or postictal state as present.  3.  Diabetes mellitus-continue with current regimen following glucose.  4.  Chronic kidney disease-appears to be near her baseline  Signed, Chrissie Noa A. Kaneshia Cater MD 07/14/2020, 8:51 AM  Pager: [phone]

## 2020-07-14 NOTE — Consult Note (Signed)
Remdesivir - Pharmacy Brief Note   O:  ALT: WNL CXR: Low lung volumes with bibasilar atelectasis. Mild bibasilar interstitial prominence* SpO2: 100% on 2 L [place]   A/P:  Remdesivir 200 mg IVPB once followed by 100 mg IVPB daily x 4 days.   Dorothe Pea, PharmD, BCPS Clinical Pharmacist  07/14/2020 8:40 AM

## 2020-07-15 DIAGNOSIS — N3090 Cystitis, unspecified without hematuria: Secondary | ICD-10-CM

## 2020-07-15 DIAGNOSIS — U071 COVID-19: Secondary | ICD-10-CM | POA: Diagnosis not present

## 2020-07-15 DIAGNOSIS — E1121 Type 2 diabetes mellitus with diabetic nephropathy: Secondary | ICD-10-CM | POA: Diagnosis not present

## 2020-07-15 DIAGNOSIS — E1122 Type 2 diabetes mellitus with diabetic chronic kidney disease: Secondary | ICD-10-CM | POA: Diagnosis not present

## 2020-07-15 LAB — COMPREHENSIVE METABOLIC PANEL
ALT: 17 U/L (ref 0–44)
AST: 24 U/L (ref 15–41)
Albumin: 2.6 g/dL — ABNORMAL LOW (ref 3.5–5.0)
Alkaline Phosphatase: 63 U/L (ref 38–126)
Anion gap: 10 (ref 5–15)
BUN: 31 mg/dL — ABNORMAL HIGH (ref 8–23)
CO2: 24 mmol/L (ref 22–32)
Calcium: 8.8 mg/dL — ABNORMAL LOW (ref 8.9–10.3)
Chloride: 101 mmol/L (ref 98–111)
Creatinine, Ser: 1.26 mg/dL — ABNORMAL HIGH (ref 0.44–1.00)
GFR, Estimated: 47 mL/min — ABNORMAL LOW (ref >60)
Glucose, Bld: 291 mg/dL — ABNORMAL HIGH (ref 70–99)
Potassium: 4 mmol/L (ref 3.5–5.1)
Sodium: 135 mmol/L (ref 135–145)
Total Bilirubin: 0.6 mg/dL (ref 0.3–1.2)
Total Protein: 6.9 g/dL (ref 6.5–8.1)

## 2020-07-15 LAB — CBC WITH DIFFERENTIAL/PLATELET
Abs Immature Granulocytes: 0.03 10*3/uL (ref 0.00–0.07)
Basophils Absolute: 0 10*3/uL (ref 0.0–0.1)
Basophils Relative: 0 %
Eosinophils Absolute: 0 10*3/uL (ref 0.0–0.5)
Eosinophils Relative: 0 %
HCT: 34.1 % — ABNORMAL LOW (ref 36.0–46.0)
Hemoglobin: 11.1 g/dL — ABNORMAL LOW (ref 12.0–15.0)
Immature Granulocytes: 1 %
Lymphocytes Relative: 19 %
Lymphs Abs: 0.9 10*3/uL (ref 0.7–4.0)
MCH: 28.2 pg (ref 26.0–34.0)
MCHC: 32.6 g/dL (ref 30.0–36.0)
MCV: 86.8 fL (ref 80.0–100.0)
Monocytes Absolute: 0.4 10*3/uL (ref 0.1–1.0)
Monocytes Relative: 8 %
Neutro Abs: 3.5 10*3/uL (ref 1.7–7.7)
Neutrophils Relative %: 72 %
Platelets: 174 10*3/uL (ref 150–400)
RBC: 3.93 MIL/uL (ref 3.87–5.11)
RDW: 12.9 % (ref 11.5–15.5)
WBC: 4.8 10*3/uL (ref 4.0–10.5)
nRBC: 0 % (ref 0.0–0.2)

## 2020-07-15 LAB — GLUCOSE, CAPILLARY
Glucose-Capillary: 230 mg/dL — ABNORMAL HIGH (ref 70–99)
Glucose-Capillary: 218 mg/dL — ABNORMAL HIGH (ref 70–99)
Glucose-Capillary: 260 mg/dL — ABNORMAL HIGH (ref 70–99)

## 2020-07-15 LAB — CBG MONITORING, ED: Glucose-Capillary: 292 mg/dL — ABNORMAL HIGH (ref 70–99)

## 2020-07-15 LAB — FIBRIN DERIVATIVES D-DIMER (ARMC ONLY): Fibrin derivatives D-dimer (ARMC): 1313.64 ng/mL (FEU) — ABNORMAL HIGH (ref 0.00–499.00)

## 2020-07-15 MED ORDER — INSULIN ASPART 100 UNIT/ML ~~LOC~~ SOLN
5.0000 [IU] | Freq: Three times a day (TID) | SUBCUTANEOUS | Status: DC
  Administered 2020-07-15: 5 [IU] via SUBCUTANEOUS
  Filled 2020-07-15 (×2): qty 1

## 2020-07-15 MED ORDER — LOSARTAN POTASSIUM 50 MG PO TABS
50.0000 mg | ORAL_TABLET | Freq: Every day | ORAL | Status: DC
  Administered 2020-07-15 – 2020-07-16 (×2): 50 mg via ORAL
  Filled 2020-07-15 (×2): qty 1

## 2020-07-15 MED ORDER — INSULIN DETEMIR 100 UNIT/ML ~~LOC~~ SOLN
60.0000 [IU] | Freq: Every day | SUBCUTANEOUS | Status: DC
  Administered 2020-07-15: 60 [IU] via SUBCUTANEOUS
  Filled 2020-07-15: qty 0.6

## 2020-07-15 NOTE — Plan of Care (Signed)
  Problem: Education: Goal: Knowledge of General Education information will improve Description: Including pain rating scale, medication(s)/side effects and non-pharmacologic comfort measures 07/15/2020 1707 by Cristela Blue, RN Outcome: Progressing 07/15/2020 1706 by Cristela Blue, RN Outcome: Progressing   Problem: Health Behavior/Discharge Planning: Goal: Ability to manage health-related needs will improve 07/15/2020 1707 by Cristela Blue, RN Outcome: Progressing 07/15/2020 1706 by Cristela Blue, RN Outcome: Progressing   Problem: Clinical Measurements: Goal: Ability to maintain clinical measurements within normal limits will improve 07/15/2020 1707 by Cristela Blue, RN Outcome: Progressing 07/15/2020 1706 by Cristela Blue, RN Outcome: Progressing Goal: Will remain free from infection 07/15/2020 1707 by Cristela Blue, RN Outcome: Progressing 07/15/2020 1706 by Cristela Blue, RN Outcome: Progressing Goal: Diagnostic test results will improve 07/15/2020 1707 by Cristela Blue, RN Outcome: Progressing 07/15/2020 1706 by Cristela Blue, RN Outcome: Progressing Goal: Respiratory complications will improve 07/15/2020 1707 by Cristela Blue, RN Outcome: Progressing 07/15/2020 1706 by Cristela Blue, RN Outcome: Progressing Goal: Cardiovascular complication will be avoided 07/15/2020 1707 by Cristela Blue, RN Outcome: Progressing 07/15/2020 1706 by Cristela Blue, RN Outcome: Progressing   Problem: Activity: Goal: Risk for activity intolerance will decrease 07/15/2020 1707 by Cristela Blue, RN Outcome: Progressing 07/15/2020 1706 by Cristela Blue, RN Outcome: Progressing   Problem: Nutrition: Goal: Adequate nutrition will be maintained 07/15/2020 1707 by Cristela Blue, RN Outcome: Progressing 07/15/2020 1706 by Cristela Blue, RN Outcome: Progressing   Problem: Coping: Goal: Level of anxiety will decrease 07/15/2020 1707 by Cristela Blue, RN Outcome:  Progressing 07/15/2020 1706 by Cristela Blue, RN Outcome: Progressing   Problem: Elimination: Goal: Will not experience complications related to bowel motility 07/15/2020 1707 by Cristela Blue, RN Outcome: Progressing 07/15/2020 1706 by Cristela Blue, RN Outcome: Progressing Goal: Will not experience complications related to urinary retention 07/15/2020 1707 by Cristela Blue, RN Outcome: Progressing 07/15/2020 1706 by Cristela Blue, RN Outcome: Progressing   Problem: Pain Managment: Goal: General experience of comfort will improve 07/15/2020 1707 by Cristela Blue, RN Outcome: Progressing 07/15/2020 1706 by Cristela Blue, RN Outcome: Progressing   Problem: Safety: Goal: Ability to remain free from injury will improve 07/15/2020 1707 by Cristela Blue, RN Outcome: Progressing 07/15/2020 1706 by Cristela Blue, RN Outcome: Progressing   Problem: Skin Integrity: Goal: Risk for impaired skin integrity will decrease 07/15/2020 1707 by Cristela Blue, RN Outcome: Progressing 07/15/2020 1706 by Cristela Blue, RN Outcome: Progressing

## 2020-07-15 NOTE — Progress Notes (Signed)
PROGRESS NOTE    Mariah Forbes  TMH:[id number] DOB: 1955-08-09 DOA: 07/13/2020 PCP: Gladstone Lighter, MD    Brief Narrative:  Mariah Forbes is a 64 year old female with past medical history significant for type 2 diabetes mellitus, essential hypertension, and CKD stage IIIb who presents to ED following fall versus syncopal episode. Patient's son called and noticed she was slightly disoriented, upon finding her at her home she was lying on the floor in which she was alert and awake. Patient was covered in urine and had been on the floor for over 12 hours. No history of seizure or seizure-like activity reported and no trauma to her tongue. Patient complaining of diffuse body pain, headache and neck pain. Patient with cough for about 1 week with decreased exercise tolerance.  Patient was recently seen in the ED 07/12/2020, diagnosed with UTI and prescribed antibiotics in which she has yet to start.  In the ED, temperature 98.7, BP 150/72, HR 72, RR 18,   Assessment & Plan:   Active Problems:   LOC (loss of consciousness) (HCC)   DM2 (diabetes mellitus, type 2) ([place])   Diabetic nephropathy associated with type 2 diabetes mellitus ([place])   Cardiac arrhythmia   GERD (gastroesophageal reflux disease)   COVID-19 virus infection   Cystitis   Acute hypoxic respiratory failure secondary to acute Covid-19 viral pneumonia during the ongoing 2020/2021 Covid 19 Pandemic - POA Patient presenting to ED via EMS after being found on the floor for greater than 12 hours covered in urine. Patient reports progressive cough and exertional dyspnea over the past week. Patient is afebrile without leukocytosis. Covid-19 PCR positive. Influenza A/B PCR negative. Chest x-ray with low lung volumes with bibasilar atelectasis, mild interstitial prominence, bibasilar interstitial edema. Received monoclonal antibody infusion with bamlanivimab/etesevimab on 07/13/2020. --COVID test: + PCR 07/13/2020 --CRP 12.6> labs  pending today --ddimer [id number] --Remdesivir, plan 5-day course (Day #2/5) --Decadron 6mg  PO daily (Day #2/10) --prone for 2-3hrs every 12hrs if able --Continue supplemental oxygen, titrate to maintain SPO2 greater than 92% --Continue supportive care with albuterol MDI prn, vitamin C, zinc, Tylenol, antitussives (benzonatate/ Mucinex/Tussionex) --Follow CBC, CMP, D-dimer, ferritin, and CRP daily --Continue airborne/contact isolation precautions for 3 weeks from the day of diagnosis inpatient (August 04, 2020)  The treatment plan and use of medications and known side effects were discussed with patient/family. Some of the medications used are based on case reports/anecdotal data.  All other medications being used in the management of COVID-19 based on limited study data.  Complete risks and long-term side effects are unknown, however in the best clinical judgment they seem to be of some benefit.  Patient wanted to proceed with treatment options provided.  Syncopal episode Patient presenting from home after being found down by her son for roughly 12 hours. Unclear etiology but suspect volume depletion from dehydration in the setting of acute Covid-19 pneumonia versus untreated UTI versus hypotension from aggressive BP regimen. EEG with no seizure activity/epileptiform discharges. TTE with LVEF 65-70%, no LV regional wall motion abnormalities, trivial MR, aortic valve normal in structure. MR brain with no acute intracranial abnormality, notable for chronic small vessel ischemia and generalized volume loss. --Cardiology following, appreciate assistance --Continue to monitor on telemetry  Elevated troponin Troponin 14, 13, 23. TTE with preserved LVEF with no regional LV wall motion abnormalities. --Cardiology following, appreciate assistance --Not a candidate for functional cardiac study while Covid positive --Continue monitor on telemetry  Urinary tract infection Patient was seen seen in the ED  on  07/12/2020 and diagnosed with UTI, discharged on antibiotics (cefdinir) in which she has yet to start. Urinalysis on admission with negative nitrite, negative leukoesterase, no bacteria and 0-5 WBCs. Urine culture with less than 10K insignificant growth. Completed 3-day course of IV ceftriaxone on 07/15/2020.   Type 2 diabetes mellitus Hemoglobin A1c 9.8, poorly controlled. Home regimen includes Levemir 65 units subcutaneously daily, NovoLog 15-20 units 3 times daily AC. --Diabetic educator following, appreciate assistance --Levemir 60 units subcutaneously daily --NovoLog 5 units 3 times daily AC --Insulin sliding scale for further coverage --CBGs before every meal/at bedtime  Essential hypertension On losartan 100 mg p.o. daily, metoprolol tartrate 50 mg p.o. twice daily, HCTZ 12.5 mg p.o. daily at home. --BP 129/61 --Continue metoprolol tartrate 50 mg PO BID --restart home losartan at reduced dose of 50mg  PO daily --Continue to hold home HCTZ --Continue aspirin and statin --Monitor blood pressure closely daily  CKD stage IIIb Baseline creatinine 1.3-1.4 with a GFR 39. --Creatinine 1.26, at baseline. --Avoid nephrotoxins, renally dose all medications --Follow renal function closely daily  Hyperlipidemia: Continue atorvastatin 40 mg p.o. daily  Peripheral neuropathy: Gabapentin 300 mg p.o. twice daily  Depression/anxiety: Fluoxetine 20 mg p.o. daily  Weakness/debility/deconditioning: --PT/OT recommends SNF placement --TOC for placement  DVT prophylaxis: Lovenox Code Status: Full code Family Communication: Updated patient extensively at bedside  Disposition Plan:  Status is: Inpatient  Remains inpatient appropriate because:Unsafe d/c plan, IV treatments appropriate due to intensity of illness or inability to take PO and Inpatient level of care appropriate due to severity of illness   Dispo: The patient is from: Home              Anticipated d/c is to: SNF               Anticipated d/c date is: 3 days              Patient currently is not medically stable to d/c.  Consultants:   Cardiology, Dr. Ubaldo Glassing  Procedures:   Transthoracic echocardiogram  Antimicrobials:   Ceftriaxone 12/28 - 12/30   Subjective: Patient seen and examined at bedside, resting comfortably. Continues with mild shortness of breath. Also with significant weakness. No other questions or concerns at this time. PT/OT recommends SNF placement, patient in agreement. Denies headache, no fever/chills/night sweats, no nausea/vomiting/diarrhea, no chest pain, no palpitations, no abdominal pain, no paresthesias. No acute events overnight per nursing staff.  Objective: Vitals:   07/15/20 0900 07/15/20 0930 07/15/20 0940 07/15/20 1041  BP: (!) 155/89 (!) 166/85  (!) 174/76  Pulse: 71 63  68  Resp: (!) 22 (!) 22    Temp:   98.5 F (36.9 C) 98.4 F (36.9 C)  TempSrc:   Oral Oral  SpO2: 97% 96%  93%  Weight:    93.1 kg  Height:    5\' 3"  (1.6 m)   No intake or output data in the 24 hours ending 07/15/20 1409 Filed Weights   07/14/20 1131 07/15/20 1041  Weight: 89.8 kg 93.1 kg    Examination:  General exam: Appears calm and comfortable  Respiratory system: Clear to auscultation. Respiratory effort normal. Supplemental oxygen now titrated off and on room air with SPO2 95%. Cardiovascular system: S1 & S2 heard, RRR. No JVD, murmurs, rubs, gallops or clicks. No pedal edema. Gastrointestinal system: Abdomen is nondistended, soft and nontender. No organomegaly or masses felt. Normal bowel sounds heard. Central nervous system: Alert and oriented. No focal neurological deficits. Extremities: Symmetric 5 x 5  power. Skin: No rashes, lesions or ulcers Psychiatry: Judgement and insight appear normal. Mood & affect appropriate.     Data Reviewed: I have personally reviewed following labs and imaging studies  CBC: Recent Labs  Lab 07/11/20 1853 07/13/20 0521 07/13/20 1017 07/14/20 0852  07/15/20 0404  WBC 15.4* 7.9 7.0 5.9 4.8  NEUTROABS  --  6.2  --   --  3.5  HGB 12.4 10.3* 10.9* 10.2* 11.1*  HCT 38.1 32.0* 35.6* 32.0* 34.1*  MCV 87.2 88.4 93.9 88.4 86.8  PLT 189 137* 154 139* 409   Basic Metabolic Panel: Recent Labs  Lab 07/11/20 1853 07/13/20 0521 07/13/20 1017 07/14/20 0852 07/15/20 0404  NA 136 136  --  133* 135  K 3.8 3.7  --  3.6 4.0  CL 101 102  --  100 101  CO2 27 23  --  25 24  GLUCOSE 225* 231*  --  86 291*  BUN 20 23  --  21 31*  CREATININE 1.22* 1.17* 1.22* 1.31* 1.26*  CALCIUM 8.8* 8.2*  --  8.0* 8.8*   GFR: Estimated Creatinine Clearance: 48.3 mL/min (A) (by C-G formula based on SCr of 1.26 mg/dL (H)). Liver Function Tests: Recent Labs  Lab 07/13/20 0521 07/14/20 0852 07/15/20 0404  AST 20 22 24   ALT 13 13 17   ALKPHOS 62 60 63  BILITOT 0.8 0.7 0.6  PROT 6.5 6.4* 6.9  ALBUMIN 2.8* 2.6* 2.6*   No results for input(s): LIPASE, AMYLASE in the last 168 hours. No results for input(s): AMMONIA in the last 168 hours. Coagulation Profile: No results for input(s): INR, PROTIME in the last 168 hours. Cardiac Enzymes: Recent Labs  Lab 07/13/20 0521  CKTOTAL 285*   BNP (last 3 results) No results for input(s): PROBNP in the last 8760 hours. HbA1C: Recent Labs    07/13/20 1017  HGBA1C 9.8*   CBG: Recent Labs  Lab 07/14/20 1204 07/14/20 1728 07/14/20 2355 07/15/20 0942 07/15/20 1048  GLUCAP 100* 233* 321* 292* 230*   Lipid Profile: No results for input(s): CHOL, HDL, LDLCALC, TRIG, CHOLHDL, LDLDIRECT in the last 72 hours. Thyroid Function Tests: Recent Labs    07/13/20 1017  TSH 1.743   Anemia Panel: No results for input(s): VITAMINB12, FOLATE, FERRITIN, TIBC, IRON, RETICCTPCT in the last 72 hours. Sepsis Labs: Recent Labs  Lab 07/13/20 0521  PROCALCITON <0.10    Recent Results (from the past 240 hour(s))  Resp Panel by RT-PCR (Flu A&B, Covid) Nasopharyngeal Swab     Status: Abnormal   Collection Time: 07/13/20   5:54 AM   Specimen: Nasopharyngeal Swab; Nasopharyngeal(NP) swabs in vial transport medium  Result Value Ref Range Status   SARS Coronavirus 2 by RT PCR POSITIVE (A) NEGATIVE Final    Comment: RESULT CALLED TO, READ BACK BY AND VERIFIED WITH: GRACIE [facility] ON 07/13/20 AT 8119 QSD (NOTE) SARS-CoV-2 target nucleic acids are DETECTED.  The SARS-CoV-2 RNA is generally detectable in upper respiratory specimens during the acute phase of infection. Positive results are indicative of the presence of the identified virus, but do not rule out bacterial infection or co-infection with other pathogens not detected by the test. Clinical correlation with patient history and other diagnostic information is necessary to determine patient infection status. The expected result is Negative.  Fact Sheet for Patients: EntrepreneurPulse.com.au  Fact Sheet for Healthcare Providers: IncredibleEmployment.be  This test is not yet approved or cleared by the [place] FDA and  has been authorized for detection and/or  diagnosis of SARS-CoV-2 by FDA under an Emergency Use Authorization (EUA).  This EUA will remain in effect (meaning this test c an be used) for the duration of  the COVID-19 declaration under Section 564(b)(1) of the Act, 21 U.S.C. section 360bbb-3(b)(1), unless the authorization is terminated or revoked sooner.     Influenza A by PCR NEGATIVE NEGATIVE Final   Influenza B by PCR NEGATIVE NEGATIVE Final    Comment: (NOTE) The Xpert Xpress SARS-CoV-2/FLU/RSV plus assay is intended as an aid in the diagnosis of influenza from Nasopharyngeal swab specimens and should not be used as a sole basis for treatment. Nasal washings and aspirates are unacceptable for Xpert Xpress SARS-CoV-2/FLU/RSV testing.  Fact Sheet for Patients: EntrepreneurPulse.com.au  Fact Sheet for Healthcare Providers: IncredibleEmployment.be  This  test is not yet approved or cleared by the [place] FDA and has been authorized for detection and/or diagnosis of SARS-CoV-2 by FDA under an Emergency Use Authorization (EUA). This EUA will remain in effect (meaning this test can be used) for the duration of the COVID-19 declaration under Section 564(b)(1) of the Act, 21 U.S.C. section 360bbb-3(b)(1), unless the authorization is terminated or revoked.  Performed at [facility], [address], [place], [place] [address]   Urine culture     Status: Abnormal   Collection Time: 07/13/20 12:50 PM   Specimen: Urine, Random  Result Value Ref Range Status   Specimen Description   Final    URINE, RANDOM Performed at [facility], [address] [place], [place] [address]    Special Requests   Final    NONE Performed at [facility], [address] [place], [place] [address]    Culture (A)  Final    <10,000 COLONIES/mL INSIGNIFICANT GROWTH Performed at [facility], [place] [address], [place], [place] [address]    Report Status 07/14/2020 FINAL  Final         Radiology Studies: EEG  Result Date: 07/13/2020 Lora Havens, MD     07/13/2020  4:47 PM Patient Name: Mariah Forbes MRN: [id number] Epilepsy Attending: Lora Havens Referring Physician/Provider: Dr Jeneen Rinks Date: 07/13/2020 Duration: 21.23 mins Patient history: 64yo F with prior CVA, now with LOC. EEG to evaluate for seizure Level of alertness: Awake, asleep AEDs during EEG study: GBP Technical aspects: This EEG study was done with scalp electrodes positioned according to the 10-20 International system of electrode placement. Electrical activity was acquired at a sampling rate of 500Hz  and reviewed with a high frequency filter of 70Hz  and a low frequency filter of 1Hz . EEG data were recorded continuously and digitally stored. Description: The posterior dominant rhythm consists of 8-9 Hz activity of moderate voltage (25-35 uV)  seen predominantly in posterior head regions, symmetric and reactive to eye opening and eye closing. Sleep was characterized by vertex waves, sleep spindles (12 to 14 Hz), maximal frontocentral region.  Hyperventilation did not show any EEG change. Physiologic photic driving was not seen during photic stimulation.  Hyperventilation was not performed.   IMPRESSION: This study is within normal limits. No seizures or epileptiform discharges were seen throughout the recording. Lora Havens   MR BRAIN WO CONTRAST  Result Date: 07/13/2020 CLINICAL DATA:  Syncope EXAM: MRI HEAD WITHOUT CONTRAST TECHNIQUE: Multiplanar, multiecho pulse sequences of the brain and surrounding structures were obtained without intravenous contrast. COMPARISON:  None. FINDINGS: Brain: No acute infarct, mass effect or extra-axial collection. No acute or chronic hemorrhage. There is multifocal hyperintense T2-weighted  signal within the white matter. Generalized volume loss without a clear lobar predilection. The midline structures are normal. The hippocampi are normal and symmetric in size and signal. The hypothalamus and mamillary bodies are normal. There is no cortical ectopia or dysplasia. Vascular: Major flow voids are preserved. Skull and upper cervical spine: Normal calvarium and skull base. Visualized upper cervical spine and soft tissues are normal. Sinuses/Orbits:Mild ethmoid and maxillary sinus mucosal thickening. Normal orbits. IMPRESSION: 1. No acute intracranial abnormality. 2. Findings of chronic small vessel ischemia and generalized volume loss. Electronically Signed   By: Ulyses Jarred M.D.   On: 07/13/2020 23:17   ECHOCARDIOGRAM COMPLETE  Result Date: 07/14/2020    ECHOCARDIOGRAM REPORT   Patient Name:   Mariah Forbes Date of Exam: 07/14/2020 Medical Rec #:  [id number]       Height:       63.0 in Accession #:    [id number]      Weight:       198.0 lb Date of Birth:  09-Mar-1956       BSA:          1.925 m Patient Age:     60 years        BP:           164/97 mmHg Patient Gender: F               HR:           84 bpm. Exam Location:  [facility] Procedure: 2D Echo, Cardiac Doppler and Color Doppler Indications:     Chest pain 786.50  History:         Patient has no prior history of Echocardiogram examinations.                  Risk Factors:Hypertension and Diabetes.  Sonographer:     Sherrie Sport RDCS (AE) Referring Phys:  [place] Diagnosing Phys: Bartholome Bill MD  Sonographer Comments: Suboptimal apical window. IMPRESSIONS  1. Left ventricular ejection fraction, by estimation, is 65 to 70%. The left ventricle has normal function. The left ventricle has no regional wall motion abnormalities. Left ventricular diastolic parameters were normal.  2. Right ventricular systolic function is normal. The right ventricular size is normal.  3. The mitral valve is grossly normal. Trivial mitral valve regurgitation.  4. The aortic valve is normal in structure. Aortic valve regurgitation is not visualized. FINDINGS  Left Ventricle: Left ventricular ejection fraction, by estimation, is 65 to 70%. The left ventricle has normal function. The left ventricle has no regional wall motion abnormalities. The left ventricular internal cavity size was normal in size. There is  no left ventricular hypertrophy. Left ventricular diastolic parameters were normal. Right Ventricle: The right ventricular size is normal. No increase in right ventricular wall thickness. Right ventricular systolic function is normal. Left Atrium: Left atrial size was normal in size. Right Atrium: Right atrial size was normal in size. Pericardium: There is no evidence of pericardial effusion. Mitral Valve: The mitral valve is grossly normal. Trivial mitral valve regurgitation. Tricuspid Valve: The tricuspid valve is not well visualized. Tricuspid valve regurgitation is not demonstrated. Aortic Valve: The aortic valve is normal in structure. Aortic valve regurgitation is not  visualized. Aortic valve mean gradient measures 6.0 mmHg. Aortic valve peak gradient measures 9.6 mmHg. Aortic valve area, by VTI measures 1.99 cm. Pulmonic Valve: The pulmonic valve was not well visualized. Pulmonic valve regurgitation is trivial. Aorta: The aortic root is normal in  size and structure. IAS/Shunts: The interatrial septum was not assessed.  LEFT VENTRICLE PLAX 2D LVIDd:         4.36 cm  Diastology LVIDs:         2.27 cm  LV e' medial:    7.18 cm/s LV PW:         1.05 cm  LV E/e' medial:  14.9 LV IVS:        1.00 cm  LV e' lateral:   10.70 cm/s LVOT diam:     2.10 cm  LV E/e' lateral: 10.0 LV SV:         60 LV SV Index:   31 LVOT Area:     3.46 cm  RIGHT VENTRICLE RV Basal diam:  3.33 cm RV S prime:     17.70 cm/s TAPSE (M-mode): 4.3 cm LEFT ATRIUM             Index       RIGHT ATRIUM           Index LA diam:        3.20 cm 1.66 cm/m  RA Area:     21.80 cm LA Vol (A2C):   48.4 ml 25.14 ml/m RA Volume:   62.10 ml  32.25 ml/m LA Vol (A4C):   60.6 ml 31.47 ml/m LA Biplane Vol: 54.5 ml 28.31 ml/m  AORTIC VALVE                    PULMONIC VALVE AV Area (Vmax):    1.90 cm     PV Vmax:       0.61 m/s AV Area (Vmean):   1.66 cm     PV Peak grad:  1.5 mmHg AV Area (VTI):     1.99 cm AV Vmax:           155.00 cm/s AV Vmean:          114.667 cm/s AV VTI:            0.299 m AV Peak Grad:      9.6 mmHg AV Mean Grad:      6.0 mmHg LVOT Vmax:         84.90 cm/s LVOT Vmean:        55.100 cm/s LVOT VTI:          0.172 m LVOT/AV VTI ratio: 0.57  AORTA Ao Root diam: 3.50 cm MITRAL VALVE                TRICUSPID VALVE MV Area (PHT): 4.15 cm     TR Peak grad:   11.7 mmHg MV Decel Time: 183 msec     TR Vmax:        171.00 cm/s MV E velocity: 107.00 cm/s MV A velocity: 97.30 cm/s   SHUNTS MV E/A ratio:  1.10         Systemic VTI:  0.17 m                             Systemic Diam: 2.10 cm Bartholome Bill MD Electronically signed by Bartholome Bill MD Signature Date/Time: 07/14/2020/12:18:13 PM    Final          Scheduled Meds:  vitamin C  500 mg Oral Daily   aspirin EC  81 mg Oral Daily   atorvastatin  40 mg Oral Daily   dexamethasone  6 mg Oral Q24H   enoxaparin (LOVENOX) injection  0.5 mg/kg Subcutaneous Q24H   fesoterodine  8 mg Oral Daily   FLUoxetine  20 mg Oral Daily   gabapentin  300 mg Oral BID   insulin aspart  0-15 Units Subcutaneous TID WC   insulin aspart  0-5 Units Subcutaneous QHS   insulin aspart  5 Units Subcutaneous TID WC   insulin detemir  60 Units Subcutaneous QHS   Ipratropium-Albuterol  1 puff Inhalation Q6H   metoCLOPramide  5 mg Oral BID   metoprolol tartrate  50 mg Oral BID   multivitamin with minerals  1 tablet Oral Daily   pantoprazole  40 mg Oral BID   senna  1 tablet Oral BID   zinc sulfate  220 mg Oral Daily   Continuous Infusions:  sodium chloride     famotidine (PEPCID) IV     remdesivir 100 mg in NS 100 mL 100 mg (07/15/20 1243)     LOS: 2 days    Time spent: 38 minutes spent on chart review, discussion with nursing staff, consultants, updating family and interview/physical exam; more than 50% of that time was spent in counseling and/or coordination of care.    Marliyah Reid J [place] DO [facility] Available via Epic secure chat 7am-7pm After these hours, please refer to coverage provider listed on [email or web address] 07/15/2020, 2:09 PM

## 2020-07-15 NOTE — Progress Notes (Signed)
Inpatient Diabetes Program Recommendations  AACE/ADA: New Consensus Statement on Inpatient Glycemic Control   Target Ranges:  Prepandial:   less than 140 mg/dL      Peak postprandial:   less than 180 mg/dL (1-2 hours)      Critically ill patients:  140 - 180 mg/dL   Results for LYRICA, MCCLARTY (MRN [id number]) as of 07/15/2020 12:13  Ref. Range 07/14/2020 08:14 07/14/2020 12:04 07/14/2020 17:28 07/14/2020 23:55 07/15/2020 09:42 07/15/2020 10:48  Glucose-Capillary Latest Ref Range: 70 - 99 mg/dL 67 (L) 100 (H) 233 (H) 321 (H) 292 (H) 230 (H)   Review of Glycemic Control  Diabetes history:DM2 Outpatient Diabetes medications:Levemir 15 units QAM, Levemir 65 units QHS, Novolog 15-20 units TID with meals Current orders for Inpatient glycemic control:Levemir 50 units QHS, Novolog 0-15 units TID with meals, Novolog 0-5 units QHS; Decadron 6 mg Q24H  Inpatient Diabetes Program Recommendations:    Insulin: If steroids are continued as ordered, please consider increasing Levemir to 60 units QHS and adding Novolog 5 units TID with meals for meal coverage if patient eats at least 50% of meals.  Thanks, Barnie Alderman, RN, MSN, CDE Diabetes Coordinator Inpatient Diabetes Program [phone] (Team Pager from 8am to 5pm)

## 2020-07-15 NOTE — ED Notes (Signed)
Attempted to collect morning labs. Unsuccessful with straight stick. Lab called to assist with collection.

## 2020-07-15 NOTE — Progress Notes (Signed)
Patient Name: Mariah Forbes Date of Encounter: 07/15/2020  Hospital Problem List     Active Problems:   LOC (loss of consciousness) ([place])   DM2 (diabetes mellitus, type 2) ([place])   Diabetic nephropathy associated with type 2 diabetes mellitus ([place])   Cardiac arrhythmia   GERD (gastroesophageal reflux disease)   COVID-19 virus infection   Cystitis    Patient Profile     64 y.o.femalewith history ofdiabetes, hypertension, renal disease who was brought to the emergency room after being found down by her family at her place of residence. She was seen in the ER several days previously with a probable UTI. Patient does not have any idea why she had fallen. She had difficulty getting up from her recliner and feels like she fell. She regained consciousness at 3 AM. She apparently was on the floor for approximately 12 hours. She was covered in urine. She has a history CVA, chronic kidney disease, hyperlipidemia and hypertension. She had cold-like symptoms earlier in the week. EKG shows sinus rhythm with no ischemia. EKG shows a chronic kidney disease with a creatinine of 1.17. Electrolytes were otherwise normal. High-sensitivity troponin was normal. Chest x-ray suggested mild bibasilar interstitial prominence. No obvious pulmonary congestion. Brain CT showed no acute abnormality. Cervical spine was cleared. Patient states she quit smoking smokes marijuana every week. Currently uses ethanol. Denies chest pain. Medications prior to admission Omnicef for her recent UTI. Occasional Zofran. She is Covid positive.  Subjective     Inpatient Medications    . vitamin C  500 mg Oral Daily  . aspirin EC  81 mg Oral Daily  . atorvastatin  40 mg Oral Daily  . dexamethasone  6 mg Oral Q24H  . enoxaparin (LOVENOX) injection  0.5 mg/kg Subcutaneous Q24H  . fesoterodine  8 mg Oral Daily  . FLUoxetine  20 mg Oral Daily  . gabapentin  300 mg Oral BID  . insulin aspart  0-15 Units  Subcutaneous TID WC  . insulin aspart  0-5 Units Subcutaneous QHS  . insulin detemir  50 Units Subcutaneous QHS  . Ipratropium-Albuterol  1 puff Inhalation Q6H  . metoCLOPramide  5 mg Oral BID  . metoprolol tartrate  50 mg Oral BID  . multivitamin with minerals  1 tablet Oral Daily  . pantoprazole  40 mg Oral BID  . senna  1 tablet Oral BID  . zinc sulfate  220 mg Oral Daily    Vital Signs    Vitals:   07/15/20 0900 07/15/20 0930 07/15/20 0940 07/15/20 1041  BP: (!) 155/89 (!) 166/85  (!) 174/76  Pulse: 71 63  68  Resp: (!) 22 (!) 22    Temp:   98.5 F (36.9 C) 98.4 F (36.9 C)  TempSrc:   Oral Oral  SpO2: 97% 96%  93%  Weight:    93.1 kg  Height:    5\' 3"  (1.6 m)   No intake or output data in the 24 hours ending 07/15/20 1222 Filed Weights   07/14/20 1131 07/15/20 1041  Weight: 89.8 kg 93.1 kg    Physical Exam      Labs    CBC Recent Labs    07/13/20 0521 07/13/20 1017 07/14/20 0852 07/15/20 0404  WBC 7.9   < > 5.9 4.8  NEUTROABS 6.2  --   --  3.5  HGB 10.3*   < > 10.2* 11.1*  HCT 32.0*   < > 32.0* 34.1*  MCV 88.4   < >  88.4 86.8  PLT 137*   < > 139* 174   < > = values in this interval not displayed.   Basic Metabolic Panel Recent Labs    07/14/20 0852 07/15/20 0404  NA 133* 135  K 3.6 4.0  CL 100 101  CO2 25 24  GLUCOSE 86 291*  BUN 21 31*  CREATININE 1.31* 1.26*  CALCIUM 8.0* 8.8*   Liver Function Tests Recent Labs    07/14/20 0852 07/15/20 0404  AST 22 24  ALT 13 17  ALKPHOS 60 63  BILITOT 0.7 0.6  PROT 6.4* 6.9  ALBUMIN 2.6* 2.6*   No results for input(s): LIPASE, AMYLASE in the last 72 hours. Cardiac Enzymes Recent Labs    07/13/20 0521  CKTOTAL 285*   BNP No results for input(s): BNP in the last 72 hours. D-Dimer No results for input(s): DDIMER in the last 72 hours. Hemoglobin A1C Recent Labs    07/13/20 1017  HGBA1C 9.8*   Fasting Lipid Panel No results for input(s): CHOL, HDL, LDLCALC, TRIG, CHOLHDL, LDLDIRECT  in the last 72 hours. Thyroid Function Tests Recent Labs    07/13/20 1017  TSH 1.743    Telemetry    nsr  ECG    nsr with no ischemia  Radiology    EEG  Result Date: 07/13/2020 Lora Havens, MD     07/13/2020  4:47 PM Patient Name: Gabby Rackers MRN: [id number] Epilepsy Attending: Lora Havens Referring Physician/Provider: Dr Jeneen Rinks Date: 07/13/2020 Duration: 21.23 mins Patient history: 64yo F with prior CVA, now with LOC. EEG to evaluate for seizure Level of alertness: Awake, asleep AEDs during EEG study: GBP Technical aspects: This EEG study was done with scalp electrodes positioned according to the 10-20 International system of electrode placement. Electrical activity was acquired at a sampling rate of 500Hz  and reviewed with a high frequency filter of 70Hz  and a low frequency filter of 1Hz . EEG data were recorded continuously and digitally stored. Description: The posterior dominant rhythm consists of 8-9 Hz activity of moderate voltage (25-35 uV) seen predominantly in posterior head regions, symmetric and reactive to eye opening and eye closing. Sleep was characterized by vertex waves, sleep spindles (12 to 14 Hz), maximal frontocentral region.  Hyperventilation did not show any EEG change. Physiologic photic driving was not seen during photic stimulation.  Hyperventilation was not performed.   IMPRESSION: This study is within normal limits. No seizures or epileptiform discharges were seen throughout the recording. Lora Havens   DG Chest 2 View  Result Date: 07/13/2020 CLINICAL DATA:  Fall. EXAM: CHEST - 2 VIEW COMPARISON:  10/15/2019. FINDINGS: Mediastinum and hilar structures normal. Heart size normal. No pulmonary venous congestion. Low lung volumes with bibasilar atelectasis. Mild bibasilar interstitial prominence. Bibasilar interstitial edema and or pneumonitis cannot be excluded. No pleural effusion or pneumothorax. IMPRESSION: Low lung volumes with  bibasilar atelectasis. Mild bibasilar interstitial prominence. Bibasilar interstitial edema and or pneumonitis cannot be excluded. Electronically Signed   By: Marcello Moores  Register   On: 07/13/2020 06:34   CT Head Wo Contrast  Result Date: 07/13/2020 CLINICAL DATA:  Unwitnessed fall, altered mental status, head injury EXAM: CT HEAD WITHOUT CONTRAST CT CERVICAL SPINE WITHOUT CONTRAST TECHNIQUE: Multidetector CT imaging of the head and cervical spine was performed following the standard protocol without intravenous contrast. Multiplanar CT image reconstructions of the cervical spine were also generated. COMPARISON:  None. FINDINGS: CT HEAD FINDINGS Brain: Normal anatomic configuration. Parenchymal volume loss is commensurate with the  patient's age. Mild periventricular white matter changes are present likely reflecting the sequela of small vessel ischemia. No abnormal intra or extra-axial mass lesion or fluid collection. No abnormal mass effect or midline shift. No evidence of acute intracranial hemorrhage or infarct. Ventricular size is normal. Cerebellum unremarkable. Vascular: No asymmetric hyperdense vasculature at the skull base. Skull: Intact Sinuses/Orbits: Moderate mucosal thickening is noted within the a maxillary sinuses bilaterally and ethmoid air cells bilaterally. No air-fluid levels. The frontal sinuses are hypoplastic. Other: Mastoid air cells and middle ear cavities are clear. CT CERVICAL SPINE FINDINGS Alignment: Normal cervical lordosis.  No listhesis. Skull base and vertebrae: The craniocervical junction is unremarkable. The atlantodental interval is normal. No acute fracture of the cervical spine. Soft tissues and spinal canal: No prevertebral fluid or swelling. No visible canal hematoma. Disc levels: Review of the sagittal images demonstrates preservation of vertebral body height. There is mild intervertebral disc space narrowing and endplate remodeling of a C4-C7 in keeping with changes of mild  degenerative disc disease. Spinal canal is widely patent. Prevertebral soft tissues are not thickened. Review of the axial images demonstrates multilevel uncovertebral and facet arthrosis without significant associated neural foraminal narrowing. Upper chest: Unremarkable Other: None IMPRESSION: No acute intracranial abnormality.  No calvarial fracture. Moderate paranasal sinus disease. No acute fracture or listhesis of the cervical spine. Electronically Signed   By: Fidela Salisbury MD   On: 07/13/2020 06:39   CT Cervical Spine Wo Contrast  Result Date: 07/13/2020 CLINICAL DATA:  Unwitnessed fall, altered mental status, head injury EXAM: CT HEAD WITHOUT CONTRAST CT CERVICAL SPINE WITHOUT CONTRAST TECHNIQUE: Multidetector CT imaging of the head and cervical spine was performed following the standard protocol without intravenous contrast. Multiplanar CT image reconstructions of the cervical spine were also generated. COMPARISON:  None. FINDINGS: CT HEAD FINDINGS Brain: Normal anatomic configuration. Parenchymal volume loss is commensurate with the patient's age. Mild periventricular white matter changes are present likely reflecting the sequela of small vessel ischemia. No abnormal intra or extra-axial mass lesion or fluid collection. No abnormal mass effect or midline shift. No evidence of acute intracranial hemorrhage or infarct. Ventricular size is normal. Cerebellum unremarkable. Vascular: No asymmetric hyperdense vasculature at the skull base. Skull: Intact Sinuses/Orbits: Moderate mucosal thickening is noted within the a maxillary sinuses bilaterally and ethmoid air cells bilaterally. No air-fluid levels. The frontal sinuses are hypoplastic. Other: Mastoid air cells and middle ear cavities are clear. CT CERVICAL SPINE FINDINGS Alignment: Normal cervical lordosis.  No listhesis. Skull base and vertebrae: The craniocervical junction is unremarkable. The atlantodental interval is normal. No acute fracture of  the cervical spine. Soft tissues and spinal canal: No prevertebral fluid or swelling. No visible canal hematoma. Disc levels: Review of the sagittal images demonstrates preservation of vertebral body height. There is mild intervertebral disc space narrowing and endplate remodeling of a C4-C7 in keeping with changes of mild degenerative disc disease. Spinal canal is widely patent. Prevertebral soft tissues are not thickened. Review of the axial images demonstrates multilevel uncovertebral and facet arthrosis without significant associated neural foraminal narrowing. Upper chest: Unremarkable Other: None IMPRESSION: No acute intracranial abnormality.  No calvarial fracture. Moderate paranasal sinus disease. No acute fracture or listhesis of the cervical spine. Electronically Signed   By: Fidela Salisbury MD   On: 07/13/2020 06:39   MR BRAIN WO CONTRAST  Result Date: 07/13/2020 CLINICAL DATA:  Syncope EXAM: MRI HEAD WITHOUT CONTRAST TECHNIQUE: Multiplanar, multiecho pulse sequences of the brain and surrounding structures were  obtained without intravenous contrast. COMPARISON:  None. FINDINGS: Brain: No acute infarct, mass effect or extra-axial collection. No acute or chronic hemorrhage. There is multifocal hyperintense T2-weighted signal within the white matter. Generalized volume loss without a clear lobar predilection. The midline structures are normal. The hippocampi are normal and symmetric in size and signal. The hypothalamus and mamillary bodies are normal. There is no cortical ectopia or dysplasia. Vascular: Major flow voids are preserved. Skull and upper cervical spine: Normal calvarium and skull base. Visualized upper cervical spine and soft tissues are normal. Sinuses/Orbits:Mild ethmoid and maxillary sinus mucosal thickening. Normal orbits. IMPRESSION: 1. No acute intracranial abnormality. 2. Findings of chronic small vessel ischemia and generalized volume loss. Electronically Signed   By: Ulyses Jarred  M.D.   On: 07/13/2020 23:17   ECHOCARDIOGRAM COMPLETE  Result Date: 07/14/2020    ECHOCARDIOGRAM REPORT   Patient Name:   LACHAE HOHLER Date of Exam: 07/14/2020 Medical Rec #:  [id number]       Height:       63.0 in Accession #:    [id number]      Weight:       198.0 lb Date of Birth:  Jan 14, 1956       BSA:          1.925 m Patient Age:    59 years        BP:           164/97 mmHg Patient Gender: F               HR:           84 bpm. Exam Location:  [facility] Procedure: 2D Echo, Cardiac Doppler and Color Doppler Indications:     Chest pain 786.50  History:         Patient has no prior history of Echocardiogram examinations.                  Risk Factors:Hypertension and Diabetes.  Sonographer:     Sherrie Sport RDCS (AE) Referring Phys:  [place] Diagnosing Phys: Bartholome Bill MD  Sonographer Comments: Suboptimal apical window. IMPRESSIONS  1. Left ventricular ejection fraction, by estimation, is 65 to 70%. The left ventricle has normal function. The left ventricle has no regional wall motion abnormalities. Left ventricular diastolic parameters were normal.  2. Right ventricular systolic function is normal. The right ventricular size is normal.  3. The mitral valve is grossly normal. Trivial mitral valve regurgitation.  4. The aortic valve is normal in structure. Aortic valve regurgitation is not visualized. FINDINGS  Left Ventricle: Left ventricular ejection fraction, by estimation, is 65 to 70%. The left ventricle has normal function. The left ventricle has no regional wall motion abnormalities. The left ventricular internal cavity size was normal in size. There is  no left ventricular hypertrophy. Left ventricular diastolic parameters were normal. Right Ventricle: The right ventricular size is normal. No increase in right ventricular wall thickness. Right ventricular systolic function is normal. Left Atrium: Left atrial size was normal in size. Right Atrium: Right atrial size was normal in size.  Pericardium: There is no evidence of pericardial effusion. Mitral Valve: The mitral valve is grossly normal. Trivial mitral valve regurgitation. Tricuspid Valve: The tricuspid valve is not well visualized. Tricuspid valve regurgitation is not demonstrated. Aortic Valve: The aortic valve is normal in structure. Aortic valve regurgitation is not visualized. Aortic valve mean gradient measures 6.0 mmHg. Aortic valve peak gradient measures 9.6 mmHg. Aortic valve  area, by VTI measures 1.99 cm. Pulmonic Valve: The pulmonic valve was not well visualized. Pulmonic valve regurgitation is trivial. Aorta: The aortic root is normal in size and structure. IAS/Shunts: The interatrial septum was not assessed.  LEFT VENTRICLE PLAX 2D LVIDd:         4.36 cm  Diastology LVIDs:         2.27 cm  LV e' medial:    7.18 cm/s LV PW:         1.05 cm  LV E/e' medial:  14.9 LV IVS:        1.00 cm  LV e' lateral:   10.70 cm/s LVOT diam:     2.10 cm  LV E/e' lateral: 10.0 LV SV:         60 LV SV Index:   31 LVOT Area:     3.46 cm  RIGHT VENTRICLE RV Basal diam:  3.33 cm RV S prime:     17.70 cm/s TAPSE (M-mode): 4.3 cm LEFT ATRIUM             Index       RIGHT ATRIUM           Index LA diam:        3.20 cm 1.66 cm/m  RA Area:     21.80 cm LA Vol (A2C):   48.4 ml 25.14 ml/m RA Volume:   62.10 ml  32.25 ml/m LA Vol (A4C):   60.6 ml 31.47 ml/m LA Biplane Vol: 54.5 ml 28.31 ml/m  AORTIC VALVE                    PULMONIC VALVE AV Area (Vmax):    1.90 cm     PV Vmax:       0.61 m/s AV Area (Vmean):   1.66 cm     PV Peak grad:  1.5 mmHg AV Area (VTI):     1.99 cm AV Vmax:           155.00 cm/s AV Vmean:          114.667 cm/s AV VTI:            0.299 m AV Peak Grad:      9.6 mmHg AV Mean Grad:      6.0 mmHg LVOT Vmax:         84.90 cm/s LVOT Vmean:        55.100 cm/s LVOT VTI:          0.172 m LVOT/AV VTI ratio: 0.57  AORTA Ao Root diam: 3.50 cm MITRAL VALVE                TRICUSPID VALVE MV Area (PHT): 4.15 cm     TR Peak grad:   11.7 mmHg  MV Decel Time: 183 msec     TR Vmax:        171.00 cm/s MV E velocity: 107.00 cm/s MV A velocity: 97.30 cm/s   SHUNTS MV E/A ratio:  1.10         Systemic VTI:  0.17 m                             Systemic Diam: 2.10 cm Bartholome Bill MD Electronically signed by Bartholome Bill MD Signature Date/Time: 07/14/2020/12:18:13 PM    Final    CT Renal Stone Study  Result Date: 07/11/2020 CLINICAL DATA:  Left-sided flank pain. EXAM: CT ABDOMEN AND  PELVIS WITHOUT CONTRAST TECHNIQUE: Multidetector CT imaging of the abdomen and pelvis was performed following the standard protocol without IV contrast. COMPARISON:  None. FINDINGS: Lower chest: There are a few hazy airspace opacities in the left lower lobe. There is a trace right-sided pleural effusion. The heart size is normal. Hepatobiliary: The liver is normal. Cholelithiasis without acute inflammation.There is no biliary ductal dilation. Pancreas: Normal contours without ductal dilatation. No peripancreatic fluid collection. Spleen: There are multiple calcifications throughout the patient's spleen. Adrenals/Urinary Tract: --Adrenal glands: Unremarkable. --Right kidney/ureter: No hydronephrosis or radiopaque kidney stones. --Left kidney/ureter: No hydronephrosis or radiopaque kidney stones. --Urinary bladder: There is gas within the urinary bladder. Stomach/Bowel: --Stomach/Duodenum: No hiatal hernia or other gastric abnormality. Normal duodenal course and caliber. --Small bowel: Unremarkable. --Colon: There is an above average amount of stool throughout the colon. --Appendix: Not visualized. No right lower quadrant inflammation or free fluid. Vascular/Lymphatic: Atherosclerotic calcification is present within the non-aneurysmal abdominal aorta, without hemodynamically significant stenosis. --No retroperitoneal lymphadenopathy. --No mesenteric lymphadenopathy. --No pelvic or inguinal lymphadenopathy. Reproductive: Status post hysterectomy. No adnexal mass. Other: No ascites or  free air. The abdominal wall is normal. Musculoskeletal. No acute displaced fractures. IMPRESSION: 1. No acute abdominopelvic abnormality. 2. There are a few hazy airspace opacities in the left lower lobe, concerning for pneumonia or aspiration. There is a trace right-sided pleural effusion. 3. There is gas within the urinary bladder. Correlate for recent instrumentation. Findings can also be seen in cystitis caused by gas-forming organism. 4. Cholelithiasis without acute inflammation. Aortic Atherosclerosis (ICD10-I70.0). Electronically Signed   By: Constance Holster M.D.   On: 07/11/2020 22:20    Assessment & Plan     64 y.o.femalewith history ofdiabetes, hypertension, renal disease who was brought to the emergency room after being found down by her family at her place of residence. She was seen in the ER several days previously with a probable UTI. Patient does not have any idea why she had fallen. She had difficulty getting up from her recliner and feels like she fell. She regained consciousness at 3 AM. She apparently was on the floor for approximately 12 hours. She was covered in urine. She has a history CVA, chronic kidney disease, hyperlipidemia and hypertension. She had cold-like symptoms earlier in the week. EKG shows sinus rhythm with no ischemia. EKG shows a chronic kidney disease with a creatinine of 1.17. Electrolytes were otherwise normal. High-sensitivity troponin was normal. Chest x-ray suggested mild bibasilar interstitial prominence. No obvious pulmonary congestion. Brain CT showed no acute abnormality. Cervical spine was cleared. Patient states she quit smoking smokes marijuana every week. Currently uses ethanol. Denies chest pain. Medications prior to admission Omnicef for her recent UTI. Occasional Zofran. She is Covid positive.  1. Loss of consciousness-etiology is unclear but does not appear to be due to any arhythmia but appears to have been orthostatic.  Has ruled out for mi and there is no evidence of abnormal rhythm.  Patient recalls getting up from a chair and felt like she may have fallen. It is unclear whether she had syncope or not. She denied any lightheadedness or dizziness per her report. She has had chest discomfort and palpitations in the past. She denies any recent chest pain. There have been no arrhythmias on her monitor thus far for the past 24 hours. Her renal function appears stable. Her electrolytes are normal. She does not appear to be in pulmonary edema. We will continue to follow on telemetry for evidence of arrhythmia. Echo showed  normal lv function with no significant valvular abnormalities.   Would continue to treat for Covid.  Not a candidate for functional study or any further in patient cardiac workup. Can proceed with further evaluation as out patient when covid symtpoms have resolved.   2. History of CVA-no evidence of progression of her neurologic deficits. No acute abnormality on head CT.  Patient was incontinent of urine however unclear whether she had seizure activity or not. No obvious seizure activity or postictal state as present.  3. Diabetes mellitus-continue with current regimen following glucose.  4. Chronic kidney disease-appears to be near her baseline   Signed, Chrissie Noa A. Lynn Sissel MD 07/15/2020, 12:22 PM  Pager: [phone]

## 2020-07-15 NOTE — Evaluation (Signed)
Physical Therapy Evaluation Patient Details Name: Mariah Forbes MRN: [id number] DOB: [birth year] Today's Date: 07/15/2020   History of Present Illness  Patient is a 64 year old female with past medical history significant for type 2 diabetes mellitus, essential hypertension, and CKD stage IIIb who presents to ED following fall versus syncopal episode. Was down on the floor for over 12 hours at home. Was in ED on 12/27 and diagnosed with UTI. Acute hypoxic respiratory failure secondary to acute Covid-19 viral pneumonia. MRI of head shows no acute intracranial abnormality  Clinical Impression  PT evaluation completed. Patient sitting up in chair on arrival to room. Patient required minimal assistance for standing from recliner chair. Patient walked a short distance in the room with some unsteadiness that required Min A. Patient also had a loss of balance in standing that required Min A for correction to midline. Patient is fatigued with activity and is generally deconditioned. Sp02 92% on room air after walking. Recommend PT to maximize independence and address functional limitations remaining. Recommend SNF at discharge.     Follow Up Recommendations SNF    Equipment Recommendations  None recommended by PT    Recommendations for Other Services       Precautions / Restrictions Precautions Precautions: Fall Restrictions Weight Bearing Restrictions: No      Mobility  Bed Mobility               General bed mobility comments: not observed as patient sitting up on arrival and post session    Transfers Overall transfer level: Needs assistance   Transfers: Sit to/from Stand Sit to Stand: Min assist         General transfer comment: steadying assistance for standing  Ambulation/Gait Ambulation/Gait assistance: Min assist Gait Distance (Feet): 20 Feet Assistive device: None Gait Pattern/deviations: Decreased step length - right;Decreased step length - left Gait velocity:  decreased   General Gait Details: patient is unsteady at times with ambulation and needs occasional minimal assistance for safety. patient fatigued with activity with Sp02 93% on room air  Stairs            Wheelchair Mobility    Modified Rankin (Stroke Patients Only)       Balance Overall balance assessment: Needs assistance Sitting-balance support: Feet supported Sitting balance-Leahy Scale: Good     Standing balance support: During functional activity Standing balance-Leahy Scale: Poor Standing balance comment: patient has one loss of balance without UE support with turning that required Min A for correction to midline                             Pertinent Vitals/Pain Pain Assessment: No/denies pain    Home Living Family/patient expects to be discharged to:: Private residence Living Arrangements: Alone Available Help at Discharge: Family;Available PRN/intermittently Type of Home: House       Home Layout: One level Home Equipment: Cane - quad;Grab bars - toilet      Prior Function Level of Independence: Independent with assistive device(s)         Comments: use of SPC     Hand Dominance   Dominant Hand: Right    Extremity/Trunk Assessment   Upper Extremity Assessment Upper Extremity Assessment: Generalized weakness    Lower Extremity Assessment Lower Extremity Assessment: Generalized weakness       Communication   Communication: No difficulties  Cognition Arousal/Alertness: Awake/alert Behavior During Therapy: WFL for tasks assessed/performed Overall Cognitive Status: Within Functional  Limits for tasks assessed                                        General Comments      Exercises     Assessment/Plan    PT Assessment Patient needs continued PT services  PT Problem List Decreased strength;Decreased activity tolerance;Decreased balance;Decreased mobility;Cardiopulmonary status limiting activity;Decreased  safety awareness       PT Treatment Interventions DME instruction;Gait training;Stair training;Functional mobility training;Therapeutic activities;Therapeutic exercise;Balance training    PT Goals (Current goals can be found in the Care Plan section)  Acute Rehab PT Goals Patient Stated Goal: to get stronger PT Goal Formulation: With patient Time For Goal Achievement: 07/29/20 Potential to Achieve Goals: Good    Frequency Min 2X/week   Barriers to discharge        Co-evaluation               AM-PAC PT "6 Clicks" Mobility  Outcome Measure Help needed turning from your back to your side while in a flat bed without using bedrails?: A Little Help needed moving from lying on your back to sitting on the side of a flat bed without using bedrails?: A Little Help needed moving to and from a bed to a chair (including a wheelchair)?: A Little Help needed standing up from a chair using your arms (e.g., wheelchair or bedside chair)?: A Little Help needed to walk in hospital room?: A Little Help needed climbing 3-5 steps with a railing? : A Lot 6 Click Score: 17    End of Session   Activity Tolerance: Patient tolerated treatment well Patient left: in chair;with call bell/phone within reach Nurse Communication: Mobility status PT Visit Diagnosis: Unsteadiness on feet (R26.81);Muscle weakness (generalized) (M62.81)    Time: 8916-9450 PT Time Calculation (min) (ACUTE ONLY): 26 min   Charges:   PT Evaluation $PT Eval Moderate Complexity: 1 Mod PT Treatments $Therapeutic Activity: 8-22 mins        Minna Merritts, PT, MPT   Percell Locus 07/15/2020, 12:49 PM

## 2020-07-15 NOTE — ED Notes (Signed)
Called and spoke with [facility] in the lab, lab tech coming down to drawn patients blood shortly.

## 2020-07-16 DIAGNOSIS — U071 COVID-19: Secondary | ICD-10-CM | POA: Diagnosis not present

## 2020-07-16 DIAGNOSIS — E1122 Type 2 diabetes mellitus with diabetic chronic kidney disease: Secondary | ICD-10-CM | POA: Diagnosis not present

## 2020-07-16 DIAGNOSIS — E1121 Type 2 diabetes mellitus with diabetic nephropathy: Secondary | ICD-10-CM | POA: Diagnosis not present

## 2020-07-16 DIAGNOSIS — N3090 Cystitis, unspecified without hematuria: Secondary | ICD-10-CM | POA: Diagnosis not present

## 2020-07-16 LAB — CBC WITH DIFFERENTIAL/PLATELET
Abs Immature Granulocytes: 0.04 10*3/uL (ref 0.00–0.07)
Basophils Absolute: 0 10*3/uL (ref 0.0–0.1)
Basophils Relative: 0 %
Eosinophils Absolute: 0 10*3/uL (ref 0.0–0.5)
Eosinophils Relative: 0 %
HCT: 33.8 % — ABNORMAL LOW (ref 36.0–46.0)
Hemoglobin: 11.2 g/dL — ABNORMAL LOW (ref 12.0–15.0)
Immature Granulocytes: 1 %
Lymphocytes Relative: 18 %
Lymphs Abs: 1.1 10*3/uL (ref 0.7–4.0)
MCH: 28.4 pg (ref 26.0–34.0)
MCHC: 33.1 g/dL (ref 30.0–36.0)
MCV: 85.8 fL (ref 80.0–100.0)
Monocytes Absolute: 0.3 10*3/uL (ref 0.1–1.0)
Monocytes Relative: 4 %
Neutro Abs: 4.6 10*3/uL (ref 1.7–7.7)
Neutrophils Relative %: 77 %
Platelets: 214 10*3/uL (ref 150–400)
RBC: 3.94 MIL/uL (ref 3.87–5.11)
RDW: 13.1 % (ref 11.5–15.5)
WBC: 6 10*3/uL (ref 4.0–10.5)
nRBC: 0 % (ref 0.0–0.2)

## 2020-07-16 LAB — COMPREHENSIVE METABOLIC PANEL
ALT: 17 U/L (ref 0–44)
AST: 22 U/L (ref 15–41)
Albumin: 2.7 g/dL — ABNORMAL LOW (ref 3.5–5.0)
Alkaline Phosphatase: 56 U/L (ref 38–126)
Anion gap: 9 (ref 5–15)
BUN: 41 mg/dL — ABNORMAL HIGH (ref 8–23)
CO2: 23 mmol/L (ref 22–32)
Calcium: 8.6 mg/dL — ABNORMAL LOW (ref 8.9–10.3)
Chloride: 103 mmol/L (ref 98–111)
Creatinine, Ser: 1.36 mg/dL — ABNORMAL HIGH (ref 0.44–1.00)
GFR, Estimated: 43 mL/min — ABNORMAL LOW (ref >60)
Glucose, Bld: 378 mg/dL — ABNORMAL HIGH (ref 70–99)
Potassium: 4.3 mmol/L (ref 3.5–5.1)
Sodium: 135 mmol/L (ref 135–145)
Total Bilirubin: 0.5 mg/dL (ref 0.3–1.2)
Total Protein: 7 g/dL (ref 6.5–8.1)

## 2020-07-16 LAB — C-REACTIVE PROTEIN
CRP: 14.3 mg/dL — ABNORMAL HIGH (ref <1.0)
CRP: 8.1 mg/dL — ABNORMAL HIGH (ref <1.0)

## 2020-07-16 LAB — GLUCOSE, CAPILLARY
Glucose-Capillary: 308 mg/dL — ABNORMAL HIGH (ref 70–99)
Glucose-Capillary: 306 mg/dL — ABNORMAL HIGH (ref 70–99)
Glucose-Capillary: 159 mg/dL — ABNORMAL HIGH (ref 70–99)
Glucose-Capillary: 184 mg/dL — ABNORMAL HIGH (ref 70–99)

## 2020-07-16 LAB — FIBRIN DERIVATIVES D-DIMER (ARMC ONLY): Fibrin derivatives D-dimer (ARMC): 1091.88 ng/mL (FEU) — ABNORMAL HIGH (ref 0.00–499.00)

## 2020-07-16 MED ORDER — INSULIN ASPART 100 UNIT/ML ~~LOC~~ SOLN
10.0000 [IU] | Freq: Three times a day (TID) | SUBCUTANEOUS | Status: DC
  Administered 2020-07-16 (×3): 10 [IU] via SUBCUTANEOUS
  Filled 2020-07-16 (×3): qty 1

## 2020-07-16 MED ORDER — INSULIN DETEMIR 100 UNIT/ML ~~LOC~~ SOLN
35.0000 [IU] | Freq: Two times a day (BID) | SUBCUTANEOUS | Status: DC
  Administered 2020-07-16 (×2): 35 [IU] via SUBCUTANEOUS
  Filled 2020-07-16 (×3): qty 0.35

## 2020-07-16 NOTE — Progress Notes (Signed)
PROGRESS NOTE    Mariah Forbes  IRJ:[id number] DOB: [birth year] DOA: 07/13/2020 PCP: Gladstone Lighter, MD    Brief Narrative:  Mariah Forbes is a 64 year old female with past medical history significant for type 2 diabetes mellitus, essential hypertension, and CKD stage IIIb who presents to ED following fall versus syncopal episode. Patient's son called and noticed she was slightly disoriented, upon finding her at her home she was lying on the floor in which she was alert and awake. Patient was covered in urine and had been on the floor for over 12 hours. No history of seizure or seizure-like activity reported and no trauma to her tongue. Patient complaining of diffuse body pain, headache and neck pain. Patient with cough for about 1 week with decreased exercise tolerance.  Patient was recently seen in the ED 07/12/2020, diagnosed with UTI and prescribed antibiotics in which she has yet to start.  In the ED, temperature 98.7, BP 150/72, HR 72, RR 18.,  Potassium 3.7, chloride 102, CO2 23, glucose 231, BUN 23, creatinine 1.17, procalcitonin less than 0.10.  WBC 7.9, hemoglobin 10.3, platelets 137.  Chest x-ray with bibasilar atelectasis, interstitial prominence, bibasilar interstitial edema and/or pneumonitis.   Assessment & Plan:   Active Problems:   LOC (loss of consciousness) (HCC)   DM2 (diabetes mellitus, type 2) ([place])   Diabetic nephropathy associated with type 2 diabetes mellitus ([place])   Cardiac arrhythmia   GERD (gastroesophageal reflux disease)   COVID-19 virus infection   Cystitis   Acute hypoxic respiratory failure secondary to acute Covid-19 viral pneumonia during the ongoing 2020/2021 Covid 19 Pandemic - POA Patient presenting to ED via EMS after being found on the floor for greater than 12 hours covered in urine. Patient reports progressive cough and exertional dyspnea over the past week. Patient is afebrile without leukocytosis. Covid-19 PCR positive. Influenza A/B PCR  negative. Chest x-ray with low lung volumes with bibasilar atelectasis, mild interstitial prominence, bibasilar interstitial edema. Received monoclonal antibody infusion with bamlanivimab/etesevimab on 07/13/2020. --COVID test: + PCR 07/13/2020 --CRP 12.6> lab pending today --ddimer [id number] --Remdesivir, plan 5-day course (Day #3/5) --Decadron 6mg  PO daily (Day #3/10) --prone for 2-3hrs every 12hrs if able --Continue supplemental oxygen, titrate to maintain SPO2 greater than 92% --Continue supportive care with albuterol MDI prn, vitamin C, zinc, Tylenol, antitussives (benzonatate/ Mucinex/Tussionex) --Follow CBC, CMP, D-dimer, ferritin, and CRP daily --Continue airborne/contact isolation precautions for 3 weeks from the day of diagnosis inpatient (August 04, 2020)  The treatment plan and use of medications and known side effects were discussed with patient/family. Some of the medications used are based on case reports/anecdotal data.  All other medications being used in the management of COVID-19 based on limited study data.  Complete risks and long-term side effects are unknown, however in the best clinical judgment they seem to be of some benefit.  Patient wanted to proceed with treatment options provided.  Syncopal episode Patient presenting from home after being found down by her son for roughly 12 hours. Unclear etiology but suspect volume depletion from dehydration in the setting of acute Covid-19 pneumonia versus untreated UTI versus hypotension from aggressive BP regimen. EEG with no seizure activity/epileptiform discharges. TTE with LVEF 65-70%, no LV regional wall motion abnormalities, trivial MR, aortic valve normal in structure. MR brain with no acute intracranial abnormality, notable for chronic small vessel ischemia and generalized volume loss. --Cardiology following, appreciate assistance --Continue to monitor on telemetry  Elevated troponin Troponin 14, 13, 23. TTE with  preserved LVEF  with no regional LV wall motion abnormalities. --Cardiology following, appreciate assistance --Not a candidate for functional cardiac study while Covid positive --Continue monitor on telemetry; no arrhythmias noted such far during hospitalization  Urinary tract infection Patient was seen seen in the ED on 07/12/2020 and diagnosed with UTI, discharged on antibiotics (cefdinir) in which she has yet to start. Urinalysis on admission with negative nitrite, negative leukoesterase, no bacteria and 0-5 WBCs. Urine culture with less than 10K insignificant growth. Completed 3-day course of IV ceftriaxone on 07/15/2020.   Type 2 diabetes mellitus Hemoglobin A1c 9.8, poorly controlled. Home regimen includes Levemir 65 units subcutaneously daily, NovoLog 15-20 units 3 times daily AC. --Diabetic educator following, appreciate assistance --Levemir 35 units subcutaneously BID --NovoLog 10 units TIDAC --Insulin sliding scale for further coverage --CBGs before every meal/at bedtime  Essential hypertension On losartan 100 mg p.o. daily, metoprolol tartrate 50 mg p.o. twice daily, HCTZ 12.5 mg p.o. daily at home. --BP 166/86 --Continue metoprolol tartrate 50 mg PO BID --restart home losartan at reduced dose of 50mg  PO daily --Continue to hold home HCTZ --Continue aspirin and statin --Monitor blood pressure closely daily  CKD stage IIIb Baseline creatinine 1.3-1.4 with a GFR 39. --Creatinine 1.26, at baseline. --Avoid nephrotoxins, renally dose all medications --Follow renal function closely daily  Hyperlipidemia: Continue atorvastatin 40 mg p.o. daily  Peripheral neuropathy: Gabapentin 300 mg p.o. twice daily  Depression/anxiety: Fluoxetine 20 mg p.o. daily  Weakness/debility/deconditioning: --PT/OT recommends SNF placement --TOC for placement  DVT prophylaxis: Lovenox Code Status: Full code Family Communication: Updated patient extensively at bedside  Disposition Plan:   Status is: Inpatient  Remains inpatient appropriate because:Unsafe d/c plan, IV treatments appropriate due to intensity of illness or inability to take PO and Inpatient level of care appropriate due to severity of illness   Dispo: The patient is from: Home              Anticipated d/c is to: SNF              Anticipated d/c date is: 2 days              Patient currently is not medically stable to d/c.  Consultants:   Cardiology, Dr. Ubaldo Glassing  Procedures:   Transthoracic echocardiogram  Antimicrobials:   Ceftriaxone 12/28 - 12/30   Subjective: Patient seen and examined at bedside, resting comfortably. Continues with mild shortness of breath and generalized weakness. No other questions or concerns at this time.  Pending SNF placement. Denies headache, no fever/chills/night sweats, no nausea/vomiting/diarrhea, no chest pain, no palpitations, no abdominal pain, no paresthesias. No acute events overnight per nursing staff.  Objective: Vitals:   07/15/20 2001 07/16/20 0522 07/16/20 0837 07/16/20 1124  BP: (!) 164/85 (!) 166/86 (!) 175/88 (!) 165/85  Pulse: 76 63 63 66  Resp: 19 18 18 18   Temp: 98.6 F (37 C) 97.7 F (36.5 C) 97.9 F (36.6 C) 97.8 F (36.6 C)  TempSrc: Oral Oral Oral Oral  SpO2: 94% 95% 95% 97%  Weight:  93.2 kg    Height:        Intake/Output Summary (Last 24 hours) at 07/16/2020 1126 Last data filed at 07/16/2020 0400 Gross per 24 hour  Intake 100 ml  Output --  Net 100 ml   Filed Weights   07/14/20 1131 07/15/20 1041 07/16/20 0522  Weight: 89.8 kg 93.1 kg 93.2 kg    Examination:  General exam: Appears calm and comfortable  Respiratory system: Clear to auscultation. Respiratory  effort normal. Supplemental oxygen now titrated off and on room air with SPO2 95%. Cardiovascular system: S1 & S2 heard, RRR. No JVD, murmurs, rubs, gallops or clicks. No pedal edema. Gastrointestinal system: Abdomen is nondistended, soft and nontender. No organomegaly or  masses felt. Normal bowel sounds heard. Central nervous system: Alert and oriented. No focal neurological deficits. Extremities: Symmetric 5 x 5 power. Skin: No rashes, lesions or ulcers Psychiatry: Judgement and insight appear normal. Mood & affect appropriate.     Data Reviewed: I have personally reviewed following labs and imaging studies  CBC: Recent Labs  Lab 07/13/20 0521 07/13/20 1017 07/14/20 0852 07/15/20 0404 07/16/20 0520  WBC 7.9 7.0 5.9 4.8 6.0  NEUTROABS 6.2  --   --  3.5 4.6  HGB 10.3* 10.9* 10.2* 11.1* 11.2*  HCT 32.0* 35.6* 32.0* 34.1* 33.8*  MCV 88.4 93.9 88.4 86.8 85.8  PLT 137* 154 139* 174 101   Basic Metabolic Panel: Recent Labs  Lab 07/11/20 1853 07/13/20 0521 07/13/20 1017 07/14/20 0852 07/15/20 0404 07/16/20 0520  NA 136 136  --  133* 135 135  K 3.8 3.7  --  3.6 4.0 4.3  CL 101 102  --  100 101 103  CO2 27 23  --  25 24 23   GLUCOSE 225* 231*  --  86 291* 378*  BUN 20 23  --  21 31* 41*  CREATININE 1.22* 1.17* 1.22* 1.31* 1.26* 1.36*  CALCIUM 8.8* 8.2*  --  8.0* 8.8* 8.6*   GFR: Estimated Creatinine Clearance: 44.7 mL/min (A) (by C-G formula based on SCr of 1.36 mg/dL (H)). Liver Function Tests: Recent Labs  Lab 07/13/20 0521 07/14/20 0852 07/15/20 0404 07/16/20 0520  AST 20 22 24 22   ALT 13 13 17 17   ALKPHOS 62 60 63 56  BILITOT 0.8 0.7 0.6 0.5  PROT 6.5 6.4* 6.9 7.0  ALBUMIN 2.8* 2.6* 2.6* 2.7*   No results for input(s): LIPASE, AMYLASE in the last 168 hours. No results for input(s): AMMONIA in the last 168 hours. Coagulation Profile: No results for input(s): INR, PROTIME in the last 168 hours. Cardiac Enzymes: Recent Labs  Lab 07/13/20 0521  CKTOTAL 285*   BNP (last 3 results) No results for input(s): PROBNP in the last 8760 hours. HbA1C: No results for input(s): HGBA1C in the last 72 hours. CBG: Recent Labs  Lab 07/15/20 1048 07/15/20 1743 07/15/20 2002 07/16/20 0836 07/16/20 1121  GLUCAP 230* 218* 260* 308*  306*   Lipid Profile: No results for input(s): CHOL, HDL, LDLCALC, TRIG, CHOLHDL, LDLDIRECT in the last 72 hours. Thyroid Function Tests: No results for input(s): TSH, T4TOTAL, FREET4, T3FREE, THYROIDAB in the last 72 hours. Anemia Panel: No results for input(s): VITAMINB12, FOLATE, FERRITIN, TIBC, IRON, RETICCTPCT in the last 72 hours. Sepsis Labs: Recent Labs  Lab 07/13/20 0521  PROCALCITON <0.10    Recent Results (from the past 240 hour(s))  Resp Panel by RT-PCR (Flu A&B, Covid) Nasopharyngeal Swab     Status: Abnormal   Collection Time: 07/13/20  5:54 AM   Specimen: Nasopharyngeal Swab; Nasopharyngeal(NP) swabs in vial transport medium  Result Value Ref Range Status   SARS Coronavirus 2 by RT PCR POSITIVE (A) NEGATIVE Final    Comment: RESULT CALLED TO, READ BACK BY AND VERIFIED WITH: GRACIE Devereux [facility] ON 07/13/20 AT 7510 QSD (NOTE) SARS-CoV-2 target nucleic acids are DETECTED.  The SARS-CoV-2 RNA is generally detectable in upper respiratory specimens during the acute phase of infection. Positive results are indicative of  the presence of the identified virus, but do not rule out bacterial infection or co-infection with other pathogens not detected by the test. Clinical correlation with patient history and other diagnostic information is necessary to determine patient infection status. The expected result is Negative.  Fact Sheet for Patients: EntrepreneurPulse.com.au  Fact Sheet for Healthcare Providers: IncredibleEmployment.be  This test is not yet approved or cleared by the [place] FDA and  has been authorized for detection and/or diagnosis of SARS-CoV-2 by FDA under an Emergency Use Authorization (EUA).  This EUA will remain in effect (meaning this test c an be used) for the duration of  the COVID-19 declaration under Section 564(b)(1) of the Act, 21 U.S.C. section 360bbb-3(b)(1), unless the authorization is terminated or  revoked sooner.     Influenza A by PCR NEGATIVE NEGATIVE Final   Influenza B by PCR NEGATIVE NEGATIVE Final    Comment: (NOTE) The Xpert Xpress SARS-CoV-2/FLU/RSV plus assay is intended as an aid in the diagnosis of influenza from Nasopharyngeal swab specimens and should not be used as a sole basis for treatment. Nasal washings and aspirates are unacceptable for Xpert Xpress SARS-CoV-2/FLU/RSV testing.  Fact Sheet for Patients: EntrepreneurPulse.com.au  Fact Sheet for Healthcare Providers: IncredibleEmployment.be  This test is not yet approved or cleared by the [place] FDA and has been authorized for detection and/or diagnosis of SARS-CoV-2 by FDA under an Emergency Use Authorization (EUA). This EUA will remain in effect (meaning this test can be used) for the duration of the COVID-19 declaration under Section 564(b)(1) of the Act, 21 U.S.C. section 360bbb-3(b)(1), unless the authorization is terminated or revoked.  Performed at [facility], [address], [place], [place] [address]   Urine culture     Status: Abnormal   Collection Time: 07/13/20 12:50 PM   Specimen: Urine, Random  Result Value Ref Range Status   Specimen Description   Final    URINE, RANDOM Performed at [facility], [address], [place], [place] [address]    Special Requests   Final    NONE Performed at [facility], [address] [place], [place] [address]    Culture (A)  Final    <10,000 COLONIES/mL INSIGNIFICANT GROWTH Performed at [facility], [place] [address], [place], [place] [address]    Report Status 07/14/2020 FINAL  Final         Radiology Studies: No results found.      Scheduled Meds: . vitamin C  500 mg Oral Daily  . aspirin EC  81 mg Oral Daily  . atorvastatin  40 mg Oral Daily  . dexamethasone  6 mg Oral Q24H  . enoxaparin (LOVENOX) injection  0.5 mg/kg Subcutaneous Q24H  . fesoterodine   8 mg Oral Daily  . FLUoxetine  20 mg Oral Daily  . gabapentin  300 mg Oral BID  . insulin aspart  0-15 Units Subcutaneous TID WC  . insulin aspart  0-5 Units Subcutaneous QHS  . insulin aspart  10 Units Subcutaneous TID WC  . insulin detemir  35 Units Subcutaneous BID  . Ipratropium-Albuterol  1 puff Inhalation Q6H  . losartan  50 mg Oral Daily  . metoCLOPramide  5 mg Oral BID  . metoprolol tartrate  50 mg Oral BID  . multivitamin with minerals  1 tablet Oral Daily  . pantoprazole  40 mg Oral BID  . senna  1 tablet Oral BID  . zinc sulfate  220 mg Oral Daily  Continuous Infusions: . sodium chloride    . famotidine (PEPCID) IV    . remdesivir 100 mg in NS 100 mL 100 mg (07/16/20 0944)     LOS: 3 days    Time spent: 38 minutes spent on chart review, discussion with nursing staff, consultants, updating family and interview/physical exam; more than 50% of that time was spent in counseling and/or coordination of care.    Taisia Fantini J [place] DO [facility] Available via Epic secure chat 7am-7pm After these hours, please refer to coverage provider listed on [email or web address] 07/16/2020, 11:26 AM

## 2020-07-17 DIAGNOSIS — E1122 Type 2 diabetes mellitus with diabetic chronic kidney disease: Secondary | ICD-10-CM | POA: Diagnosis not present

## 2020-07-17 DIAGNOSIS — E1121 Type 2 diabetes mellitus with diabetic nephropathy: Secondary | ICD-10-CM | POA: Diagnosis not present

## 2020-07-17 DIAGNOSIS — N3090 Cystitis, unspecified without hematuria: Secondary | ICD-10-CM | POA: Diagnosis not present

## 2020-07-17 DIAGNOSIS — U071 COVID-19: Secondary | ICD-10-CM | POA: Diagnosis not present

## 2020-07-17 LAB — COMPREHENSIVE METABOLIC PANEL
ALT: 17 U/L (ref 0–44)
AST: 22 U/L (ref 15–41)
Albumin: 2.5 g/dL — ABNORMAL LOW (ref 3.5–5.0)
Alkaline Phosphatase: 52 U/L (ref 38–126)
Anion gap: 10 (ref 5–15)
BUN: 48 mg/dL — ABNORMAL HIGH (ref 8–23)
CO2: 23 mmol/L (ref 22–32)
Calcium: 8.6 mg/dL — ABNORMAL LOW (ref 8.9–10.3)
Chloride: 101 mmol/L (ref 98–111)
Creatinine, Ser: 1.26 mg/dL — ABNORMAL HIGH (ref 0.44–1.00)
GFR, Estimated: 47 mL/min — ABNORMAL LOW (ref >60)
Glucose, Bld: 261 mg/dL — ABNORMAL HIGH (ref 70–99)
Potassium: 4.1 mmol/L (ref 3.5–5.1)
Sodium: 134 mmol/L — ABNORMAL LOW (ref 135–145)
Total Bilirubin: 0.5 mg/dL (ref 0.3–1.2)
Total Protein: 6.2 g/dL — ABNORMAL LOW (ref 6.5–8.1)

## 2020-07-17 LAB — CBC WITH DIFFERENTIAL/PLATELET
Abs Immature Granulocytes: 0.06 10*3/uL (ref 0.00–0.07)
Basophils Absolute: 0 10*3/uL (ref 0.0–0.1)
Basophils Relative: 0 %
Eosinophils Absolute: 0 10*3/uL (ref 0.0–0.5)
Eosinophils Relative: 0 %
HCT: 31.3 % — ABNORMAL LOW (ref 36.0–46.0)
Hemoglobin: 10.3 g/dL — ABNORMAL LOW (ref 12.0–15.0)
Immature Granulocytes: 1 %
Lymphocytes Relative: 24 %
Lymphs Abs: 1.8 10*3/uL (ref 0.7–4.0)
MCH: 28.4 pg (ref 26.0–34.0)
MCHC: 32.9 g/dL (ref 30.0–36.0)
MCV: 86.2 fL (ref 80.0–100.0)
Monocytes Absolute: 0.5 10*3/uL (ref 0.1–1.0)
Monocytes Relative: 7 %
Neutro Abs: 5.1 10*3/uL (ref 1.7–7.7)
Neutrophils Relative %: 68 %
Platelets: 211 10*3/uL (ref 150–400)
RBC: 3.63 MIL/uL — ABNORMAL LOW (ref 3.87–5.11)
RDW: 13 % (ref 11.5–15.5)
WBC: 7.4 10*3/uL (ref 4.0–10.5)
nRBC: 0 % (ref 0.0–0.2)

## 2020-07-17 LAB — GLUCOSE, CAPILLARY
Glucose-Capillary: 195 mg/dL — ABNORMAL HIGH (ref 70–99)
Glucose-Capillary: 171 mg/dL — ABNORMAL HIGH (ref 70–99)
Glucose-Capillary: 128 mg/dL — ABNORMAL HIGH (ref 70–99)
Glucose-Capillary: 166 mg/dL — ABNORMAL HIGH (ref 70–99)

## 2020-07-17 LAB — C-REACTIVE PROTEIN: CRP: 4.2 mg/dL — ABNORMAL HIGH (ref <1.0)

## 2020-07-17 LAB — FIBRIN DERIVATIVES D-DIMER (ARMC ONLY): Fibrin derivatives D-dimer (ARMC): 761.86 ng/mL (FEU) — ABNORMAL HIGH (ref 0.00–499.00)

## 2020-07-17 MED ORDER — INSULIN ASPART 100 UNIT/ML ~~LOC~~ SOLN
14.0000 [IU] | Freq: Three times a day (TID) | SUBCUTANEOUS | Status: DC
  Administered 2020-07-17 – 2020-07-18 (×5): 14 [IU] via SUBCUTANEOUS
  Filled 2020-07-17 (×5): qty 1

## 2020-07-17 MED ORDER — INSULIN DETEMIR 100 UNIT/ML ~~LOC~~ SOLN
40.0000 [IU] | Freq: Two times a day (BID) | SUBCUTANEOUS | Status: DC
  Administered 2020-07-17 – 2020-07-18 (×3): 40 [IU] via SUBCUTANEOUS
  Filled 2020-07-17 (×5): qty 0.4

## 2020-07-17 MED ORDER — LOSARTAN POTASSIUM 50 MG PO TABS
100.0000 mg | ORAL_TABLET | Freq: Every day | ORAL | Status: DC
  Administered 2020-07-17 – 2020-07-18 (×2): 100 mg via ORAL
  Filled 2020-07-17 (×2): qty 2

## 2020-07-17 NOTE — Progress Notes (Signed)
PROGRESS NOTE    Mariah Forbes  MWN:[id number] DOB: 04/06/1956 DOA: 07/13/2020 PCP: Gladstone Lighter, MD    Brief Narrative:  Mariah Forbes is a 65 year old female with past medical history significant for type 2 diabetes mellitus, essential hypertension, and CKD stage IIIb who presents to ED following fall versus syncopal episode. Patient's son called and noticed she was slightly disoriented, upon finding her at her home she was lying on the floor in which she was alert and awake. Patient was covered in urine and had been on the floor for over 12 hours. No history of seizure or seizure-like activity reported and no trauma to her tongue. Patient complaining of diffuse body pain, headache and neck pain. Patient with cough for about 1 week with decreased exercise tolerance.  Patient was recently seen in the ED 07/12/2020, diagnosed with UTI and prescribed antibiotics in which she has yet to start.  In the ED, temperature 98.7, BP 150/72, HR 72, RR 18.,  Potassium 3.7, chloride 102, CO2 23, glucose 231, BUN 23, creatinine 1.17, procalcitonin less than 0.10.  WBC 7.9, hemoglobin 10.3, platelets 137.  Chest x-ray with bibasilar atelectasis, interstitial prominence, bibasilar interstitial edema and/or pneumonitis.   Assessment & Plan:   Active Problems:   LOC (loss of consciousness) (HCC)   DM2 (diabetes mellitus, type 2) ([place])   Diabetic nephropathy associated with type 2 diabetes mellitus ([place])   Cardiac arrhythmia   GERD (gastroesophageal reflux disease)   COVID-19 virus infection   Cystitis   Acute hypoxic respiratory failure secondary to acute Covid-19 viral pneumonia during the ongoing 2020/2021 Covid 19 Pandemic - POA Patient presenting to ED via EMS after being found on the floor for greater than 12 hours covered in urine. Patient reports progressive cough and exertional dyspnea over the past week. Patient is afebrile without leukocytosis. Covid-19 PCR positive. Influenza A/B PCR  negative. Chest x-ray with low lung volumes with bibasilar atelectasis, mild interstitial prominence, bibasilar interstitial edema. Received monoclonal antibody infusion with bamlanivimab/etesevimab on 07/13/2020. --COVID test: + PCR 07/13/2020 --CRP 12.6>8.1 lab pending today --ddimer [id number] --Remdesivir, plan 5-day course (Day #4/5) --Decadron 6mg  PO daily (Day #4/10) --prone for 2-3hrs every 12hrs if able --Continue supplemental oxygen, titrate to maintain SPO2 greater than 92%, oxygen now titrated off to room air with SPO2 96% --Continue supportive care with albuterol MDI prn, vitamin C, zinc, Tylenol, antitussives (benzonatate/ Mucinex/Tussionex) --Follow CBC, CMP, D-dimer, ferritin, and CRP daily --Continue airborne/contact isolation precautions for 3 weeks from the day of diagnosis inpatient (August 04, 2020)  The treatment plan and use of medications and known side effects were discussed with patient/family. Some of the medications used are based on case reports/anecdotal data.  All other medications being used in the management of COVID-19 based on limited study data.  Complete risks and long-term side effects are unknown, however in the best clinical judgment they seem to be of some benefit.  Patient wanted to proceed with treatment options provided.  Syncopal episode Patient presenting from home after being found down by her son for roughly 12 hours. Unclear etiology but suspect volume depletion from dehydration in the setting of acute Covid-19 pneumonia versus untreated UTI versus hypotension from aggressive BP regimen. EEG with no seizure activity/epileptiform discharges. TTE with LVEF 65-70%, no LV regional wall motion abnormalities, trivial MR, aortic valve normal in structure. MR brain with no acute intracranial abnormality, notable for chronic small vessel ischemia and generalized volume loss. --Cardiology following, appreciate assistance --Continue to monitor on  telemetry  Elevated troponin Troponin 14, 13, 23. TTE with preserved LVEF with no regional LV wall motion abnormalities. --Cardiology following, appreciate assistance --Not a candidate for functional cardiac study while Covid positive --Continue monitor on telemetry; no arrhythmias noted such far during hospitalization  Urinary tract infection Patient was seen seen in the ED on 07/12/2020 and diagnosed with UTI, discharged on antibiotics (cefdinir) in which she has yet to start. Urinalysis on admission with negative nitrite, negative leukoesterase, no bacteria and 0-5 WBCs. Urine culture with less than 10K insignificant growth. Completed 3-day course of IV ceftriaxone on 07/15/2020.   Type 2 diabetes mellitus Hemoglobin A1c 9.8, poorly controlled. Home regimen includes Levemir 65 units subcutaneously daily, NovoLog 15-20 units 3 times daily AC. --Diabetic educator following, appreciate assistance --Levemir 40 units subcutaneously BID --NovoLog 14 units TIDAC --Insulin sliding scale for further coverage --CBGs before every meal/at bedtime  Essential hypertension On losartan 100 mg p.o. daily, metoprolol tartrate 50 mg p.o. twice daily, HCTZ 12.5 mg p.o. daily at home. --BP 141/72 --Continue metoprolol tartrate 50 mg PO BID, and losartan 100mg  PO daily --Continue to hold home HCTZ --Continue aspirin and statin --Monitor blood pressure closely daily  CKD stage IIIb Baseline creatinine 1.3-1.4 with a GFR 39. --Creatinine 1.26, at baseline. --Avoid nephrotoxins, renally dose all medications --Follow renal function closely daily  Hyperlipidemia: Continue atorvastatin 40 mg p.o. daily  Peripheral neuropathy: Gabapentin 300 mg p.o. twice daily  Depression/anxiety: Fluoxetine 20 mg p.o. daily  Weakness/debility/deconditioning: --PT/OT recommends SNF placement --TOC for placement  DVT prophylaxis: Lovenox Code Status: Full code Family Communication: Updated patient extensively at  bedside  Disposition Plan:  Status is: Inpatient  Remains inpatient appropriate because:Unsafe d/c plan, IV treatments appropriate due to intensity of illness or inability to take PO and Inpatient level of care appropriate due to severity of illness   Dispo: The patient is from: Home              Anticipated d/c is to: SNF              Anticipated d/c date is: 2 days              Patient currently is not medically stable to d/c.  Consultants:   Cardiology, Dr. Ubaldo Glassing  Procedures:   Transthoracic echocardiogram  Antimicrobials:   Ceftriaxone 12/28 - 12/30   Subjective: Patient seen and examined at bedside, resting comfortably. Continues with mild shortness of breath and generalized weakness. No other questions or concerns at this time.  Pending SNF placement. Denies headache, no fever/chills/night sweats, no nausea/vomiting/diarrhea, no chest pain, no palpitations, no abdominal pain, no paresthesias. No acute events overnight per nursing staff.  Objective: Vitals:   07/16/20 2336 07/17/20 0501 07/17/20 0823 07/17/20 1200  BP: (!) 159/78 (!) 141/72 (!) 156/90 (!) 174/89  Pulse: 63 62 60 64  Resp: 19 18 20    Temp: 98 F (36.7 C) 99.3 F (37.4 C) 97.8 F (36.6 C) 98.7 F (37.1 C)  TempSrc: Oral Oral Oral Oral  SpO2: 95% 96% 97% 98%  Weight:      Height:       No intake or output data in the 24 hours ending 07/17/20 1223 Filed Weights   07/14/20 1131 07/15/20 1041 07/16/20 0522  Weight: 89.8 kg 93.1 kg 93.2 kg    Examination:  General exam: Appears calm and comfortable  Respiratory system: Clear to auscultation. Respiratory effort normal. Supplemental oxygen now titrated off and on room air with SPO2 96%. Cardiovascular system:  S1 & S2 heard, RRR. No JVD, murmurs, rubs, gallops or clicks. No pedal edema. Gastrointestinal system: Abdomen is nondistended, soft and nontender. No organomegaly or masses felt. Normal bowel sounds heard. Central nervous system: Alert and  oriented. No focal neurological deficits. Extremities: Symmetric 5 x 5 power. Skin: No rashes, lesions or ulcers Psychiatry: Judgement and insight appear normal. Mood & affect appropriate.     Data Reviewed: I have personally reviewed following labs and imaging studies  CBC: Recent Labs  Lab 07/13/20 0521 07/13/20 1017 07/14/20 0852 07/15/20 0404 07/16/20 0520 07/17/20 0524  WBC 7.9 7.0 5.9 4.8 6.0 7.4  NEUTROABS 6.2  --   --  3.5 4.6 5.1  HGB 10.3* 10.9* 10.2* 11.1* 11.2* 10.3*  HCT 32.0* 35.6* 32.0* 34.1* 33.8* 31.3*  MCV 88.4 93.9 88.4 86.8 85.8 86.2  PLT 137* 154 139* 174 214 366   Basic Metabolic Panel: Recent Labs  Lab 07/13/20 0521 07/13/20 1017 07/14/20 0852 07/15/20 0404 07/16/20 0520 07/17/20 0524  NA 136  --  133* 135 135 134*  K 3.7  --  3.6 4.0 4.3 4.1  CL 102  --  100 101 103 101  CO2 23  --  25 24 23 23   GLUCOSE 231*  --  86 291* 378* 261*  BUN 23  --  21 31* 41* 48*  CREATININE 1.17* 1.22* 1.31* 1.26* 1.36* 1.26*  CALCIUM 8.2*  --  8.0* 8.8* 8.6* 8.6*   GFR: Estimated Creatinine Clearance: 48.3 mL/min (A) (by C-G formula based on SCr of 1.26 mg/dL (H)). Liver Function Tests: Recent Labs  Lab 07/13/20 0521 07/14/20 0852 07/15/20 0404 07/16/20 0520 07/17/20 0524  AST 20 22 24 22 22   ALT 13 13 17 17 17   ALKPHOS 62 60 63 56 52  BILITOT 0.8 0.7 0.6 0.5 0.5  PROT 6.5 6.4* 6.9 7.0 6.2*  ALBUMIN 2.8* 2.6* 2.6* 2.7* 2.5*   No results for input(s): LIPASE, AMYLASE in the last 168 hours. No results for input(s): AMMONIA in the last 168 hours. Coagulation Profile: No results for input(s): INR, PROTIME in the last 168 hours. Cardiac Enzymes: Recent Labs  Lab 07/13/20 0521  CKTOTAL 285*   BNP (last 3 results) No results for input(s): PROBNP in the last 8760 hours. HbA1C: No results for input(s): HGBA1C in the last 72 hours. CBG: Recent Labs  Lab 07/16/20 1121 07/16/20 1656 07/16/20 2012 07/17/20 0820 07/17/20 1157  GLUCAP 306* 159*  184* 195* 171*   Lipid Profile: No results for input(s): CHOL, HDL, LDLCALC, TRIG, CHOLHDL, LDLDIRECT in the last 72 hours. Thyroid Function Tests: No results for input(s): TSH, T4TOTAL, FREET4, T3FREE, THYROIDAB in the last 72 hours. Anemia Panel: No results for input(s): VITAMINB12, FOLATE, FERRITIN, TIBC, IRON, RETICCTPCT in the last 72 hours. Sepsis Labs: Recent Labs  Lab 07/13/20 0521  PROCALCITON <0.10    Recent Results (from the past 240 hour(s))  Resp Panel by RT-PCR (Flu A&B, Covid) Nasopharyngeal Swab     Status: Abnormal   Collection Time: 07/13/20  5:54 AM   Specimen: Nasopharyngeal Swab; Nasopharyngeal(NP) swabs in vial transport medium  Result Value Ref Range Status   SARS Coronavirus 2 by RT PCR POSITIVE (A) NEGATIVE Final    Comment: RESULT CALLED TO, READ BACK BY AND VERIFIED WITH: GRACIE [facility] ON 07/13/20 AT 2947 QSD (NOTE) SARS-CoV-2 target nucleic acids are DETECTED.  The SARS-CoV-2 RNA is generally detectable in upper respiratory specimens during the acute phase of infection. Positive results are indicative of  the presence of the identified virus, but do not rule out bacterial infection or co-infection with other pathogens not detected by the test. Clinical correlation with patient history and other diagnostic information is necessary to determine patient infection status. The expected result is Negative.  Fact Sheet for Patients: EntrepreneurPulse.com.au  Fact Sheet for Healthcare Providers: IncredibleEmployment.be  This test is not yet approved or cleared by the [place] FDA and  has been authorized for detection and/or diagnosis of SARS-CoV-2 by FDA under an Emergency Use Authorization (EUA).  This EUA will remain in effect (meaning this test c an be used) for the duration of  the COVID-19 declaration under Section 564(b)(1) of the Act, 21 U.S.C. section 360bbb-3(b)(1), unless the authorization  is terminated or revoked sooner.     Influenza A by PCR NEGATIVE NEGATIVE Final   Influenza B by PCR NEGATIVE NEGATIVE Final    Comment: (NOTE) The Xpert Xpress SARS-CoV-2/FLU/RSV plus assay is intended as an aid in the diagnosis of influenza from Nasopharyngeal swab specimens and should not be used as a sole basis for treatment. Nasal washings and aspirates are unacceptable for Xpert Xpress SARS-CoV-2/FLU/RSV testing.  Fact Sheet for Patients: EntrepreneurPulse.com.au  Fact Sheet for Healthcare Providers: IncredibleEmployment.be  This test is not yet approved or cleared by the [place] FDA and has been authorized for detection and/or diagnosis of SARS-CoV-2 by FDA under an Emergency Use Authorization (EUA). This EUA will remain in effect (meaning this test can be used) for the duration of the COVID-19 declaration under Section 564(b)(1) of the Act, 21 U.S.C. section 360bbb-3(b)(1), unless the authorization is terminated or revoked.  Performed at [facility], [address], [place], [place] [address]   Urine culture     Status: Abnormal   Collection Time: 07/13/20 12:50 PM   Specimen: Urine, Random  Result Value Ref Range Status   Specimen Description   Final    URINE, RANDOM Performed at [facility], [address] [place], [place] [address]    Special Requests   Final    NONE Performed at [facility], [address] [place], [place] [address]    Culture (A)  Final    <10,000 COLONIES/mL INSIGNIFICANT GROWTH Performed at [facility], [place] [address] [place], [place] [address]    Report Status 07/14/2020 FINAL  Final         Radiology Studies: No results found.      Scheduled Meds: . vitamin C  500 mg Oral Daily  . aspirin EC  81 mg Oral Daily  . atorvastatin  40 mg Oral Daily  . dexamethasone  6 mg Oral Q24H  . enoxaparin (LOVENOX) injection  0.5 mg/kg Subcutaneous Q24H   . fesoterodine  8 mg Oral Daily  . FLUoxetine  20 mg Oral Daily  . gabapentin  300 mg Oral BID  . insulin aspart  0-15 Units Subcutaneous TID WC  . insulin aspart  0-5 Units Subcutaneous QHS  . insulin aspart  14 Units Subcutaneous TID WC  . insulin detemir  40 Units Subcutaneous BID  . Ipratropium-Albuterol  1 puff Inhalation Q6H  . losartan  100 mg Oral Daily  . metoCLOPramide  5 mg Oral BID  . metoprolol tartrate  50 mg Oral BID  . multivitamin with minerals  1 tablet Oral Daily  . pantoprazole  40 mg Oral BID  . senna  1 tablet Oral BID  . zinc sulfate  220 mg Oral Daily  Continuous Infusions: . sodium chloride    . famotidine (PEPCID) IV    . remdesivir 100 mg in NS 100 mL Stopped (07/16/20 1014)     LOS: 4 days    Time spent: 36 minutes spent on chart review, discussion with nursing staff, consultants, updating family and interview/physical exam; more than 50% of that time was spent in counseling and/or coordination of care.    Mariah Newlun J [place] DO [facility] Available via Epic secure chat 7am-7pm After these hours, please refer to coverage provider listed on [email or web address] 07/17/2020, 12:23 PM

## 2020-07-18 DIAGNOSIS — J9601 Acute respiratory failure with hypoxia: Secondary | ICD-10-CM | POA: Diagnosis not present

## 2020-07-18 DIAGNOSIS — U071 COVID-19: Principal | ICD-10-CM

## 2020-07-18 DIAGNOSIS — E1121 Type 2 diabetes mellitus with diabetic nephropathy: Secondary | ICD-10-CM | POA: Diagnosis not present

## 2020-07-18 LAB — COMPREHENSIVE METABOLIC PANEL
ALT: 19 U/L (ref 0–44)
AST: 22 U/L (ref 15–41)
Albumin: 2.7 g/dL — ABNORMAL LOW (ref 3.5–5.0)
Alkaline Phosphatase: 51 U/L (ref 38–126)
Anion gap: 10 (ref 5–15)
BUN: 49 mg/dL — ABNORMAL HIGH (ref 8–23)
CO2: 25 mmol/L (ref 22–32)
Calcium: 8.6 mg/dL — ABNORMAL LOW (ref 8.9–10.3)
Chloride: 102 mmol/L (ref 98–111)
Creatinine, Ser: 1.42 mg/dL — ABNORMAL HIGH (ref 0.44–1.00)
GFR, Estimated: 41 mL/min — ABNORMAL LOW (ref >60)
Glucose, Bld: 185 mg/dL — ABNORMAL HIGH (ref 70–99)
Potassium: 4.2 mmol/L (ref 3.5–5.1)
Sodium: 137 mmol/L (ref 135–145)
Total Bilirubin: 0.5 mg/dL (ref 0.3–1.2)
Total Protein: 6.3 g/dL — ABNORMAL LOW (ref 6.5–8.1)

## 2020-07-18 LAB — CBC WITH DIFFERENTIAL/PLATELET
Abs Immature Granulocytes: 0.14 10*3/uL — ABNORMAL HIGH (ref 0.00–0.07)
Basophils Absolute: 0 10*3/uL (ref 0.0–0.1)
Basophils Relative: 0 %
Eosinophils Absolute: 0 10*3/uL (ref 0.0–0.5)
Eosinophils Relative: 0 %
HCT: 33.9 % — ABNORMAL LOW (ref 36.0–46.0)
Hemoglobin: 11.1 g/dL — ABNORMAL LOW (ref 12.0–15.0)
Immature Granulocytes: 2 %
Lymphocytes Relative: 29 %
Lymphs Abs: 2.7 10*3/uL (ref 0.7–4.0)
MCH: 28 pg (ref 26.0–34.0)
MCHC: 32.7 g/dL (ref 30.0–36.0)
MCV: 85.6 fL (ref 80.0–100.0)
Monocytes Absolute: 0.7 10*3/uL (ref 0.1–1.0)
Monocytes Relative: 7 %
Neutro Abs: 5.8 10*3/uL (ref 1.7–7.7)
Neutrophils Relative %: 62 %
Platelets: 259 10*3/uL (ref 150–400)
RBC: 3.96 MIL/uL (ref 3.87–5.11)
RDW: 13.2 % (ref 11.5–15.5)
Smear Review: NORMAL
WBC: 9.3 10*3/uL (ref 4.0–10.5)
nRBC: 0 % (ref 0.0–0.2)

## 2020-07-18 LAB — FIBRIN DERIVATIVES D-DIMER (ARMC ONLY): Fibrin derivatives D-dimer (ARMC): 632.56 ng/mL (FEU) — ABNORMAL HIGH (ref 0.00–499.00)

## 2020-07-18 LAB — C-REACTIVE PROTEIN: CRP: 2.5 mg/dL — ABNORMAL HIGH (ref <1.0)

## 2020-07-18 LAB — GLUCOSE, CAPILLARY: Glucose-Capillary: 214 mg/dL — ABNORMAL HIGH (ref 70–99)

## 2020-07-18 MED ORDER — ASCORBIC ACID 500 MG PO TABS: 500.0000 mg | ORAL_TABLET | Freq: Every day | ORAL | 0 refills | Status: AC

## 2020-07-18 MED ORDER — DEXAMETHASONE 6 MG PO TABS: 6.0000 mg | ORAL_TABLET | ORAL | 0 refills | Status: AC

## 2020-07-18 MED ORDER — ZINC SULFATE 220 (50 ZN) MG PO CAPS: 220.0000 mg | ORAL_CAPSULE | Freq: Every day | ORAL | 0 refills | Status: AC

## 2020-07-18 NOTE — TOC Transition Note (Signed)
Transition of Care [facility]) - CM/SW Discharge Note   Patient Details  Name: Mariah Forbes MRN: [id number] Date of Birth: [birth year]  [facility] CM/SW Contact:  Meriel Flavors, LCSW Phone Number: 07/18/2020, 12:44 PM   Clinical Narrative:    PT updated patient evaluation, patient going home instead of SNF         Patient Goals and CMS Choice        Discharge Placement                       Discharge Plan and Services                                     Social Determinants of Health (SDOH) Interventions     Readmission Risk Interventions No flowsheet data found.

## 2020-07-18 NOTE — Discharge Summary (Addendum)
Physician Discharge Summary  Patient ID: Mariah Forbes MRN: [id number] DOB/AGE: 09/03/1955 65 y.o.  Admit date: 07/13/2020 Discharge date: 07/18/2020  Admission Diagnoses:  Discharge Diagnoses:  Active Problems:   LOC (loss of consciousness) (HCC)   DM2 (diabetes mellitus, type 2) ([place])   Diabetic nephropathy associated with type 2 diabetes mellitus ([place])   Cardiac arrhythmia   GERD (gastroesophageal reflux disease)   COVID-19 virus infection   Cystitis   Discharged Condition: good  Hospital Course: Mariah Forbes a 65 year old female with past medical history significant for type 2 diabetes mellitus, essential hypertension, and CKD stage IIIb who presents to ED following fall versus syncopal episode. Patient's son called and noticed she was slightly disoriented, upon finding her at her home she was lying on the floor in which she was alert and awake. Patient was covered in urine and had been on the floor for over 12 hours. No history of seizure or seizure-like activity reported and no trauma to her tongue. Patient complaining of diffuse body pain, headache and neck pain. Patient with cough for about 1 week with decreased exercise tolerance.  Patient was recently seen in the ED 07/12/2020, diagnosed with UTI and prescribed antibiotics in which she has yet to start.  In the ED, temperature 98.7, BP 150/72, HR 72, RR 18., Potassium 3.7, chloride 102, CO2 23, glucose 231, BUN 23, creatinine 1.17, procalcitonin less than 0.10. WBC 7.9, hemoglobin 10.3, platelets 137. Chest x-ray with bibasilar atelectasis, interstitial prominence, bibasilar interstitial edema and/or pneumonitis.  #1. Acute hypoxemic respiratory failure secondary to COVID-19 pneumonia. COVID-19 pneumonia. Patient condition has improved.  She has been off oxygen since yesterday. She will finish the final dose of remdesivir today. Continue oral dexamethasone. Patient has been seen by physical therapy/Occupational  Therapy, initially recommended nursing placement.  Apparently, patient condition had improved, after reassessment by physical therapy, recommendation has been changed to home.  Currently she is medically stable to be discharged.  We will set her up home care and physical therapy.  #2.  Syncope. Most likely secondary to dehydration and Covid.  Echocardiogram showed normal ejection fraction, no valvular abnormality. I reviewed the patient's telemetry record, did not see any cardiac arrhythmia.  3.  Type 2 diabetes with chronic kidney disease stage IIIb. Continue current regimen with Levemir and scheduled NovoLog.  4.  Pure essential hypertension. Continue home medicines     Consults: None  Significant Diagnostic Studies: MRI HEAD WITHOUT CONTRAST  TECHNIQUE: Multiplanar, multiecho pulse sequences of the brain and surrounding structures were obtained without intravenous contrast.  COMPARISON:  None.  FINDINGS: Brain: No acute infarct, mass effect or extra-axial collection. No acute or chronic hemorrhage. There is multifocal hyperintense T2-weighted signal within the white matter. Generalized volume loss without a clear lobar predilection. The midline structures are normal.  The hippocampi are normal and symmetric in size and signal. The hypothalamus and mamillary bodies are normal. There is no cortical ectopia or dysplasia.  Vascular: Major flow voids are preserved.  Skull and upper cervical spine: Normal calvarium and skull base. Visualized upper cervical spine and soft tissues are normal.  Sinuses/Orbits:Mild ethmoid and maxillary sinus mucosal thickening. Normal orbits.  IMPRESSION: 1. No acute intracranial abnormality. 2. Findings of chronic small vessel ischemia and generalized volume loss.   Electronically Signed   By: Ulyses Jarred M.D.   On: 07/13/2020 23:17  CHEST - 2 VIEW  COMPARISON:  10/15/2019.  FINDINGS: Mediastinum and hilar  structures normal. Heart size normal. No pulmonary venous congestion. Low  lung volumes with bibasilar atelectasis. Mild bibasilar interstitial prominence. Bibasilar interstitial edema and or pneumonitis cannot be excluded. No pleural effusion or pneumothorax.  IMPRESSION: Low lung volumes with bibasilar atelectasis. Mild bibasilar interstitial prominence. Bibasilar interstitial edema and or pneumonitis cannot be excluded.   Electronically Signed   By: Marcello Moores  Register   On: 07/13/2020 06:34    Treatments: Steroids, remdesivir.  Discharge Exam: Blood pressure (!) 159/81, pulse 61, temperature 98.9 F (37.2 C), temperature source Oral, resp. rate 16, height 5\' 3"  (1.6 m), weight 93.2 kg, SpO2 97 %. General appearance: alert and cooperative Resp: clear to auscultation bilaterally Cardio: regular rate and rhythm, S1, S2 normal, no murmur, click, rub or gallop GI: soft, non-tender; bowel sounds normal; no masses,  no organomegaly Extremities: extremities normal, atraumatic, no cyanosis or edema  Disposition: Discharge disposition: 01-Home or Self Care       Discharge Instructions    Diet - low sodium heart healthy   Complete by: As directed    Increase activity slowly   Complete by: As directed      Allergies as of 07/18/2020      Reactions   Lisinopril Cough   Other Swelling   Ace Inhibitors Itching, Rash   Other reaction(s): Cough      Medication List    STOP taking these medications   cefdinir 300 MG capsule Commonly known as: OMNICEF   HumaLOG KwikPen 100 UNIT/ML KwikPen Generic drug: insulin lispro     TAKE these medications   ascorbic acid 500 MG tablet Commonly known as: VITAMIN C Take 1 tablet (500 mg total) by mouth daily for 14 days. Start taking on: July 19, 2020   aspirin 81 MG chewable tablet Chew 81 mg by mouth daily.   atorvastatin 40 MG tablet Commonly known as: LIPITOR Take 40 mg by mouth at bedtime.   dexamethasone 6 MG  tablet Commonly known as: DECADRON Take 1 tablet (6 mg total) by mouth daily for 5 days. Start taking on: July 19, 2020   famotidine 20 MG tablet Commonly known as: PEPCID Take 20 mg by mouth 2 (two) times daily.   FLUoxetine 20 MG capsule Commonly known as: PROZAC Take 20 mg by mouth daily.   gabapentin 300 MG capsule Commonly known as: NEURONTIN Take 300 mg by mouth 2 (two) times daily.   hydrochlorothiazide 12.5 MG tablet Commonly known as: HYDRODIURIL Take 12.5 mg by mouth daily.   Jardiance 25 MG Tabs tablet Generic drug: empagliflozin Take 25 mg by mouth daily.   Levemir FlexTouch 100 UNIT/ML FlexPen Generic drug: insulin detemir Inject 65 Units into the skin daily.   losartan 100 MG tablet Commonly known as: COZAAR Take 100 mg by mouth daily.   meclizine 12.5 MG tablet Commonly known as: ANTIVERT Take 12.5 mg by mouth 2 (two) times daily.   metoprolol tartrate 50 MG tablet Commonly known as: LOPRESSOR Take 50 mg by mouth 2 (two) times daily.   Myrbetriq 25 MG Tb24 tablet Generic drug: mirabegron ER Take 25 mg by mouth daily.   NovoLOG FlexPen 100 UNIT/ML FlexPen Generic drug: insulin aspart Inject 15-20 Units into the skin 3 (three) times daily.   ondansetron 4 MG disintegrating tablet Commonly known as: Zofran ODT Take 1 tablet (4 mg total) by mouth every 8 (eight) hours as needed for nausea or vomiting.   pantoprazole 40 MG tablet Commonly known as: PROTONIX Take 40 mg by mouth daily.   Toviaz 8 MG Tb24 tablet Generic drug: fesoterodine  Take 8 mg by mouth daily.   traZODone 100 MG tablet Commonly known as: DESYREL Take 50 mg by mouth at bedtime.   Victoza 18 MG/3ML Sopn Generic drug: liraglutide Inject 0.6-1.8 mg into the skin daily.   zinc sulfate 220 (50 Zn) MG capsule Take 1 capsule (220 mg total) by mouth daily for 14 days. Start taking on: July 19, 2020       Follow-up Information    Gladstone Lighter, MD Follow up in 1  week(s).   Specialty: Internal Medicine Contact information: [place] [address] [phone]               Signed: Sharen Hones 07/18/2020, 1:15 PM

## 2020-07-18 NOTE — NC FL2 (Signed)
[place] LEVEL OF CARE SCREENING TOOL     IDENTIFICATION  Patient Name: Mariah Forbes Birthdate: Aug 08, 1955 Sex: female Admission Date (Current Location): 07/13/2020  [place] and [place] Number:  Engineering geologist and Address:  [facility] [address], [place], [place] [address]      Provider Number:    Attending Physician Name and Address:  Sharen Hones, MD  Relative Name and Phone Number:  Rick, [place] (Son)   [phone]    Current Level of Care: Hospital Recommended Level of Care: [place] Prior Approval Number:    Date Approved/Denied:   PASRR Number: pending  Discharge Plan: SNF    Current Diagnoses: Patient Active Problem List   Diagnosis Date Noted  . LOC (loss of consciousness) ([place]) 07/13/2020  . DM2 (diabetes mellitus, type 2) ([place]) 07/13/2020  . Diabetic nephropathy associated with type 2 diabetes mellitus ([place]) 07/13/2020  . Cardiac arrhythmia 07/13/2020  . GERD (gastroesophageal reflux disease) 07/13/2020  . COVID-19 virus infection 07/13/2020  . Cystitis 07/13/2020    Orientation RESPIRATION BLADDER Height & Weight     Self,Place,Situation    Continent Weight: 205 lb 6.4 oz (93.2 kg) Height:  5\' 3"  (160 cm)  BEHAVIORAL SYMPTOMS/MOOD NEUROLOGICAL BOWEL NUTRITION STATUS      Continent    AMBULATORY STATUS COMMUNICATION OF NEEDS Skin   Extensive Assist Verbally                         Personal Care Assistance Level of Assistance  Bathing,Feeding,Dressing Bathing Assistance: Limited assistance Feeding assistance: Independent Dressing Assistance: Limited assistance     Functional Limitations Info             SPECIAL CARE FACTORS FREQUENCY  PT (By licensed PT),OT (By licensed OT)     PT Frequency: 5 x weekly OT Frequency: 5 x weekly            Contractures      Additional Factors Info  Code Status,Allergies Code Status Info: Full Allergies Info:  Lisinpril, Ace inhibitors           Current Medications (07/18/2020):  This is the current hospital active medication list Current Facility-Administered Medications  Medication Dose Route Frequency Provider Last Rate Last Admin  . 0.9 %  sodium chloride infusion   Intravenous PRN Neena Rhymes, MD 10 mL/hr at 07/18/20 1024 250 mL at 07/18/20 1024  . acetaminophen (TYLENOL) tablet 650 mg  650 mg Oral Q6H PRN Norins, Heinz Knuckles, MD   650 mg at 07/13/20 1556   Or  . acetaminophen (TYLENOL) suppository 650 mg  650 mg Rectal Q6H PRN Norins, Heinz Knuckles, MD      . albuterol (VENTOLIN HFA) 108 (90 Base) MCG/ACT inhaler 2 puff  2 puff Inhalation Q6H PRN [place] Eric J, DO      . ascorbic acid (VITAMIN C) tablet 500 mg  500 mg Oral Daily [place] Eric J, DO   500 mg at 07/18/20 1000  . aspirin EC tablet 81 mg  81 mg Oral Daily Norins, Heinz Knuckles, MD   81 mg at 07/18/20 1000  . atorvastatin (LIPITOR) tablet 40 mg  40 mg Oral Daily Norins, Heinz Knuckles, MD   40 mg at 07/18/20 1000  . chlorpheniramine-HYDROcodone (TUSSIONEX) 10-8 MG/5ML suspension 5 mL  5 mL Oral Q12H PRN [place] Eric J, DO      . dexamethasone (DECADRON) tablet 6 mg  6 mg Oral  Q24H [place] Eric J, DO   6 mg at 07/18/20 1001  . dicyclomine (BENTYL) tablet 20 mg  20 mg Oral TID PRN Norins, Heinz Knuckles, MD      . diphenhydrAMINE (BENADRYL) injection 50 mg  50 mg Intravenous Once PRN Norins, Heinz Knuckles, MD      . enoxaparin (LOVENOX) injection 45 mg  0.5 mg/kg Subcutaneous Q24H Norins, Heinz Knuckles, MD   45 mg at 07/17/20 2055  . EPINEPHrine (EPI-PEN) injection 0.3 mg  0.3 mg Intramuscular Once PRN Norins, Heinz Knuckles, MD      . famotidine (PEPCID) IVPB 20 mg premix  20 mg Intravenous Once PRN Norins, Heinz Knuckles, MD      . fesoterodine (TOVIAZ) tablet 8 mg  8 mg Oral Daily Norins, Heinz Knuckles, MD   8 mg at 07/18/20 1001  . FLUoxetine (PROZAC) capsule 20 mg  20 mg Oral Daily Norins, Heinz Knuckles, MD   20 mg at 07/18/20 1000  . gabapentin (NEURONTIN)  capsule 300 mg  300 mg Oral BID Norins, Heinz Knuckles, MD   300 mg at 07/18/20 1000  . guaiFENesin-dextromethorphan (ROBITUSSIN DM) 100-10 MG/5ML syrup 10 mL  10 mL Oral Q4H PRN [place] Eric J, DO   10 mL at 07/18/20 1025  . insulin aspart (novoLOG) injection 0-15 Units  0-15 Units Subcutaneous TID WC [place] Eric J, DO   3 Units at 07/18/20 0957  . insulin aspart (novoLOG) injection 0-5 Units  0-5 Units Subcutaneous QHS [place] Eric J, DO   3 Units at 07/15/20 2205  . insulin aspart (novoLOG) injection 14 Units  14 Units Subcutaneous TID WC [place] Eric J, DO   14 Units at 07/18/20 0865  . insulin detemir (LEVEMIR) injection 40 Units  40 Units Subcutaneous BID [place] Eric J, DO   40 Units at 07/18/20 7846  . Ipratropium-Albuterol (COMBIVENT) respimat 1 puff  1 puff Inhalation Q6H [place] Eric J, DO   1 puff at 07/18/20 1005  . losartan (COZAAR) tablet 100 mg  100 mg Oral Daily [place] Donnamarie Poag, DO   100 mg at 07/18/20 1000  . methylPREDNISolone sodium succinate (SOLU-MEDROL) 125 mg/2 mL injection 125 mg  125 mg Intravenous Once PRN Norins, Heinz Knuckles, MD      . metoCLOPramide (REGLAN) tablet 5 mg  5 mg Oral BID Norins, Heinz Knuckles, MD   5 mg at 07/18/20 1000  . metoprolol tartrate (LOPRESSOR) tablet 50 mg  50 mg Oral BID Norins, Heinz Knuckles, MD   50 mg at 07/18/20 1000  . multivitamin with minerals tablet 1 tablet  1 tablet Oral Daily [place] Donnamarie Poag, DO   1 tablet at 07/18/20 1000  . pantoprazole (PROTONIX) EC tablet 40 mg  40 mg Oral BID Norins, Heinz Knuckles, MD   40 mg at 07/18/20 1000  . remdesivir 100 mg in sodium chloride 0.9 % 100 mL IVPB  100 mg Intravenous Daily [place] Donnamarie Poag, DO 200 mL/hr at 07/18/20 1025 100 mg at 07/18/20 1025  . senna (SENOKOT) tablet 8.6 mg  1 tablet Oral BID Norins, Heinz Knuckles, MD   8.6 mg at 07/18/20 1000  . SUMAtriptan (IMITREX) tablet 50 mg  50 mg Oral Q2H PRN Norins, Heinz Knuckles, MD      . traZODone (DESYREL) tablet 50 mg  50 mg Oral QHS PRN Neena Rhymes, MD   50 mg  at 07/17/20 2054  . zinc sulfate capsule 220 mg  220 mg Oral Daily [place] Donnamarie Poag, DO  220 mg at 07/18/20 1001     Discharge Medications: Please see discharge summary for a list of discharge medications.  Relevant Imaging Results:  Relevant Lab Results:   Additional Information SS # [id number]  Meriel Flavors, LCSW

## 2020-07-18 NOTE — Progress Notes (Signed)
PROGRESS NOTE    Mariah Forbes  IWO:[id number] DOB: July 26, 1955 DOA: 07/13/2020 PCP: Gladstone Lighter, MD   Chief complaint.  Shortness of breath Brief Narrative:  Mariah Forbes is a 65 year old female with past medical history significant for type 2 diabetes mellitus, essential hypertension, and CKD stage IIIb who presents to ED following fall versus syncopal episode. Patient's son called and noticed she was slightly disoriented, upon finding her at her home she was lying on the floor in which she was alert and awake. Patient was covered in urine and had been on the floor for over 12 hours. No history of seizure or seizure-like activity reported and no trauma to her tongue. Patient complaining of diffuse body pain, headache and neck pain. Patient with cough for about 1 week with decreased exercise tolerance.  Patient was recently seen in the ED 07/12/2020, diagnosed with UTI and prescribed antibiotics in which she has yet to start.  In the ED, temperature 98.7, BP 150/72, HR 72, RR 18.,  Potassium 3.7, chloride 102, CO2 23, glucose 231, BUN 23, creatinine 1.17, procalcitonin less than 0.10.  WBC 7.9, hemoglobin 10.3, platelets 137.  Chest x-ray with bibasilar atelectasis, interstitial prominence, bibasilar interstitial edema and/or pneumonitis.   Assessment & Plan:   Active Problems:   LOC (loss of consciousness) (HCC)   DM2 (diabetes mellitus, type 2) ([place])   Diabetic nephropathy associated with type 2 diabetes mellitus ([place])   Cardiac arrhythmia   GERD (gastroesophageal reflux disease)   COVID-19 virus infection   Cystitis  #1. Acute hypoxemic respiratory failure secondary to COVID-19 pneumonia. COVID-19 pneumonia. Patient condition has improved.  She has been off oxygen since yesterday. She will finish the final dose of remdesivir today. Continue oral dexamethasone. Patient has been evaluated by physical therapy/Occupational Therapy, recommended SNF placement. I will  consult TOC. Also ask physical therapy and Occupational Therapy to reassess patient tomorrow.  #2.  Syncope. Most likely secondary to dehydration and Covid.  Echocardiogram showed normal ejection fraction, no valvular abnormality.  3.  Type 2 diabetes with chronic kidney disease stage IIIb. Continue current regimen with Levemir and scheduled NovoLog.  4.  Pure essential hypertension. Continue home medicines     DVT prophylaxis: Lovenox Code Status: Full Family Communication: None Disposition Plan:  .   Status is: Inpatient  Remains inpatient appropriate because:Unsafe d/c plan   Dispo:  Patient From: Home  Planned Disposition: [place]  Expected discharge date: 07/19/2020  Medically stable for discharge: No         No intake/output data recorded. No intake/output data recorded.     Consultants:   None  Procedures: None  Antimicrobials: None  Subjective: Patient feels well today.  Denies any short of breath or cough. She does not have any diarrhea or abdominal pain.  Appetite is improving. No fever or chills. No dysuria hematuria No chest pain or palpitation.  Objective: Vitals:   07/17/20 1200 07/17/20 1631 07/17/20 2113 07/18/20 0655  BP: (!) 174/89 131/61 (!) 145/71 (!) 170/75  Pulse: 64 62 63 62  Resp:   20 16  Temp: 98.7 F (37.1 C) 98.6 F (37 C) 97.8 F (36.6 C) 97.8 F (36.6 C)  TempSrc: Oral Oral Oral Oral  SpO2: 98% 95% 96% 98%  Weight:      Height:        Intake/Output Summary (Last 24 hours) at 07/18/2020 1020 Last data filed at 07/17/2020 2200 Gross per 24 hour  Intake 0 ml  Output 0 ml  Net 0 ml   Filed Weights   07/14/20 1131 07/15/20 1041 07/16/20 0522  Weight: 89.8 kg 93.1 kg 93.2 kg    Examination:  General exam: Appears calm and comfortable  Respiratory system: Clear to auscultation. Respiratory effort normal. Cardiovascular system: S1 & S2 heard, RRR. No JVD, murmurs, rubs, gallops or clicks. No  pedal edema. Gastrointestinal system: Abdomen is nondistended, soft and nontender. No organomegaly or masses felt. Normal bowel sounds heard. Central nervous system: Alert and oriented x3. No focal neurological deficits. Extremities: Symmetric 5 x 5 power. Skin: No rashes, lesions or ulcers Psychiatry: Mood & affect appropriate.     Data Reviewed: I have personally reviewed following labs and imaging studies  CBC: Recent Labs  Lab 07/13/20 0521 07/13/20 1017 07/14/20 0852 07/15/20 0404 07/16/20 0520 07/17/20 0524 07/18/20 0544  WBC 7.9   < > 5.9 4.8 6.0 7.4 9.3  NEUTROABS 6.2  --   --  3.5 4.6 5.1 5.8  HGB 10.3*   < > 10.2* 11.1* 11.2* 10.3* 11.1*  HCT 32.0*   < > 32.0* 34.1* 33.8* 31.3* 33.9*  MCV 88.4   < > 88.4 86.8 85.8 86.2 85.6  PLT 137*   < > 139* 174 214 211 259   < > = values in this interval not displayed.   Basic Metabolic Panel: Recent Labs  Lab 07/14/20 0852 07/15/20 0404 07/16/20 0520 07/17/20 0524 07/18/20 0544  NA 133* 135 135 134* 137  K 3.6 4.0 4.3 4.1 4.2  CL 100 101 103 101 102  CO2 25 24 23 23 25   GLUCOSE 86 291* 378* 261* 185*  BUN 21 31* 41* 48* 49*  CREATININE 1.31* 1.26* 1.36* 1.26* 1.42*  CALCIUM 8.0* 8.8* 8.6* 8.6* 8.6*   GFR: Estimated Creatinine Clearance: 42.8 mL/min (A) (by C-G formula based on SCr of 1.42 mg/dL (H)). Liver Function Tests: Recent Labs  Lab 07/14/20 0852 07/15/20 0404 07/16/20 0520 07/17/20 0524 07/18/20 0544  AST 22 24 22 22 22   ALT 13 17 17 17 19   ALKPHOS 60 63 56 52 51  BILITOT 0.7 0.6 0.5 0.5 0.5  PROT 6.4* 6.9 7.0 6.2* 6.3*  ALBUMIN 2.6* 2.6* 2.7* 2.5* 2.7*   No results for input(s): LIPASE, AMYLASE in the last 168 hours. No results for input(s): AMMONIA in the last 168 hours. Coagulation Profile: No results for input(s): INR, PROTIME in the last 168 hours. Cardiac Enzymes: Recent Labs  Lab 07/13/20 0521  CKTOTAL 285*   BNP (last 3 results) No results for input(s): PROBNP in the last 8760  hours. HbA1C: No results for input(s): HGBA1C in the last 72 hours. CBG: Recent Labs  Lab 07/16/20 2012 07/17/20 0820 07/17/20 1157 07/17/20 1628 07/17/20 2051  GLUCAP 184* 195* 171* 128* 166*   Lipid Profile: No results for input(s): CHOL, HDL, LDLCALC, TRIG, CHOLHDL, LDLDIRECT in the last 72 hours. Thyroid Function Tests: No results for input(s): TSH, T4TOTAL, FREET4, T3FREE, THYROIDAB in the last 72 hours. Anemia Panel: No results for input(s): VITAMINB12, FOLATE, FERRITIN, TIBC, IRON, RETICCTPCT in the last 72 hours. Sepsis Labs: Recent Labs  Lab 07/13/20 0521  PROCALCITON <0.10    Recent Results (from the past 240 hour(s))  Resp Panel by RT-PCR (Flu A&B, Covid) Nasopharyngeal Swab     Status: Abnormal   Collection Time: 07/13/20  5:54 AM   Specimen: Nasopharyngeal Swab; Nasopharyngeal(NP) swabs in vial transport medium  Result Value Ref Range Status   SARS Coronavirus 2 by RT PCR POSITIVE (A)  NEGATIVE Final    Comment: RESULT CALLED TO, READ BACK BY AND VERIFIED WITH: GRACIE [facility] ON 07/13/20 AT 0254 QSD (NOTE) SARS-CoV-2 target nucleic acids are DETECTED.  The SARS-CoV-2 RNA is generally detectable in upper respiratory specimens during the acute phase of infection. Positive results are indicative of the presence of the identified virus, but do not rule out bacterial infection or co-infection with other pathogens not detected by the test. Clinical correlation with patient history and other diagnostic information is necessary to determine patient infection status. The expected result is Negative.  Fact Sheet for Patients: EntrepreneurPulse.com.au  Fact Sheet for Healthcare Providers: IncredibleEmployment.be  This test is not yet approved or cleared by the [place] FDA and  has been authorized for detection and/or diagnosis of SARS-CoV-2 by FDA under an Emergency Use Authorization (EUA).  This EUA will remain in effect  (meaning this test c an be used) for the duration of  the COVID-19 declaration under Section 564(b)(1) of the Act, 21 U.S.C. section 360bbb-3(b)(1), unless the authorization is terminated or revoked sooner.     Influenza A by PCR NEGATIVE NEGATIVE Final   Influenza B by PCR NEGATIVE NEGATIVE Final    Comment: (NOTE) The Xpert Xpress SARS-CoV-2/FLU/RSV plus assay is intended as an aid in the diagnosis of influenza from Nasopharyngeal swab specimens and should not be used as a sole basis for treatment. Nasal washings and aspirates are unacceptable for Xpert Xpress SARS-CoV-2/FLU/RSV testing.  Fact Sheet for Patients: EntrepreneurPulse.com.au  Fact Sheet for Healthcare Providers: IncredibleEmployment.be  This test is not yet approved or cleared by the [place] FDA and has been authorized for detection and/or diagnosis of SARS-CoV-2 by FDA under an Emergency Use Authorization (EUA). This EUA will remain in effect (meaning this test can be used) for the duration of the COVID-19 declaration under Section 564(b)(1) of the Act, 21 U.S.C. section 360bbb-3(b)(1), unless the authorization is terminated or revoked.  Performed at [facility], [address] [place], [place] [address]   Urine culture     Status: Abnormal   Collection Time: 07/13/20 12:50 PM   Specimen: Urine, Random  Result Value Ref Range Status   Specimen Description   Final    URINE, RANDOM Performed at [facility], [address] [place], [place] [address]    Special Requests   Final    NONE Performed at [facility], [address] [place], [place] [address]    Culture (A)  Final    <10,000 COLONIES/mL INSIGNIFICANT GROWTH Performed at [facility], [place] [address], [place], [place] [address]    Report Status 07/14/2020 FINAL  Final         Radiology Studies: No results found.      Scheduled Meds: . vitamin C  500 mg  Oral Daily  . aspirin EC  81 mg Oral Daily  . atorvastatin  40 mg Oral Daily  . dexamethasone  6 mg Oral Q24H  . enoxaparin (LOVENOX) injection  0.5 mg/kg Subcutaneous Q24H  . fesoterodine  8 mg Oral Daily  . FLUoxetine  20 mg Oral Daily  . gabapentin  300 mg Oral BID  . insulin aspart  0-15 Units Subcutaneous TID WC  . insulin aspart  0-5 Units Subcutaneous QHS  . insulin aspart  14 Units Subcutaneous TID WC  . insulin detemir  40 Units Subcutaneous BID  . Ipratropium-Albuterol  1 puff Inhalation Q6H  . losartan  100 mg Oral Daily  . metoCLOPramide  5 mg Oral BID  . metoprolol tartrate  50 mg Oral BID  . multivitamin with minerals  1 tablet Oral Daily  . pantoprazole  40 mg Oral BID  . senna  1 tablet Oral BID  . zinc sulfate  220 mg Oral Daily   Continuous Infusions: . sodium chloride    . famotidine (PEPCID) IV    . remdesivir 100 mg in NS 100 mL Stopped (07/17/20 1312)     LOS: 5 days    Time spent: 27 minutes    Sharen Hones, MD [facility]   To contact the attending provider between 7A-7P or the covering provider during after hours 7P-7A, please log into the web site [email or web address] and access using universal [place] password for that web site. If you do not have the password, please call the hospital operator.  07/18/2020, 10:20 AM

## 2020-07-18 NOTE — [place] Notes (Signed)
Mariah Forbes to be D/C'd Home with DME walker per MD order.  Discussed prescriptions and follow up appointments with the patient. Prescriptions given to patient, medication list explained in detail. Pt verbalized understanding.  Allergies as of 07/18/2020      Reactions   Lisinopril Cough   Other Swelling   Ace Inhibitors Itching, Rash   Other reaction(s): Cough      Medication List    STOP taking these medications   cefdinir 300 MG capsule Commonly known as: OMNICEF   HumaLOG KwikPen 100 UNIT/ML KwikPen Generic drug: insulin lispro     TAKE these medications   ascorbic acid 500 MG tablet Commonly known as: VITAMIN C Take 1 tablet (500 mg total) by mouth daily for 14 days. Start taking on: July 19, 2020   aspirin 81 MG chewable tablet Chew 81 mg by mouth daily.   atorvastatin 40 MG tablet Commonly known as: LIPITOR Take 40 mg by mouth at bedtime.   dexamethasone 6 MG tablet Commonly known as: DECADRON Take 1 tablet (6 mg total) by mouth daily for 5 days. Start taking on: July 19, 2020   famotidine 20 MG tablet Commonly known as: PEPCID Take 20 mg by mouth 2 (two) times daily.   FLUoxetine 20 MG capsule Commonly known as: PROZAC Take 20 mg by mouth daily.   gabapentin 300 MG capsule Commonly known as: NEURONTIN Take 300 mg by mouth 2 (two) times daily.   hydrochlorothiazide 12.5 MG tablet Commonly known as: HYDRODIURIL Take 12.5 mg by mouth daily.   Jardiance 25 MG Tabs tablet Generic drug: empagliflozin Take 25 mg by mouth daily.   Levemir FlexTouch 100 UNIT/ML FlexPen Generic drug: insulin detemir Inject 65 Units into the skin daily.   losartan 100 MG tablet Commonly known as: COZAAR Take 100 mg by mouth daily.   meclizine 12.5 MG tablet Commonly known as: ANTIVERT Take 12.5 mg by mouth 2 (two) times daily.   metoprolol tartrate 50 MG tablet Commonly known as: LOPRESSOR Take 50 mg by mouth 2 (two) times daily.   Myrbetriq 25 MG Tb24  tablet Generic drug: mirabegron ER Take 25 mg by mouth daily.   NovoLOG FlexPen 100 UNIT/ML FlexPen Generic drug: insulin aspart Inject 15-20 Units into the skin 3 (three) times daily.   ondansetron 4 MG disintegrating tablet Commonly known as: Zofran ODT Take 1 tablet (4 mg total) by mouth every 8 (eight) hours as needed for nausea or vomiting.   pantoprazole 40 MG tablet Commonly known as: PROTONIX Take 40 mg by mouth daily.   Toviaz 8 MG Tb24 tablet Generic drug: fesoterodine Take 8 mg by mouth daily.   traZODone 100 MG tablet Commonly known as: DESYREL Take 50 mg by mouth at bedtime.   Victoza 18 MG/3ML Sopn Generic drug: liraglutide Inject 0.6-1.8 mg into the skin daily.   zinc sulfate 220 (50 Zn) MG capsule Take 1 capsule (220 mg total) by mouth daily for 14 days. Start taking on: July 19, 2020       Vitals:   07/18/20 0655 07/18/20 1117  BP: (!) 170/75 (!) 159/81  Pulse: 62 61  Resp: 16   Temp: 97.8 F (36.6 C) 98.9 F (37.2 C)  SpO2: 98% 97%    Skin clean, dry and intact without evidence of skin break down, no evidence of skin tears noted. IV catheter discontinued intact. Site without signs and symptoms of complications. Dressing and pressure applied. Pt denies pain at this time. No complaints noted.  An After Visit Summary was printed and given to the patient. Patient escorted via [place], and D/C home via private auto.  Fuller Mandril, RN

## 2020-07-18 NOTE — Progress Notes (Signed)
Physical Therapy Treatment Patient Details Name: Mariah Forbes MRN: [id number] DOB: [birth year] Today's Date: 07/18/2020    History of Present Illness Patient is a 65 year old female with past medical history significant for type 2 diabetes mellitus, essential hypertension, and CKD stage IIIb who presents to ED following fall versus syncopal episode. Was down on the floor for over 12 hours at home. Was in ED on 12/27 and diagnosed with UTI. Acute hypoxic respiratory failure secondary to acute Covid-19 viral pneumonia. MRI of head shows no acute intracranial abnormality    PT Comments    Feeling better.  Is able to get in and out of bed without assist.  Walks 2 laps in room with RW and supervision and uses bathroom without assist. No LOB or dizziness noted.  Limited by fatigue.  Discussed energy conservation and pacing of activities.  Discussed discharge plan.  Stated she feels comfortable going home with a walker.  Will update discharge recommendations.   Follow Up Recommendations  Home health PT;Supervision for mobility/OOB     Equipment Recommendations  Rolling walker with 5" wheels    Recommendations for Other Services       Precautions / Restrictions Precautions Precautions: Fall    Mobility  Bed Mobility Overal bed mobility: Modified Independent       Supine to sit: Modified independent (Device/Increase time) Sit to supine: Modified independent (Device/Increase time)      Transfers Overall transfer level: Modified independent   Transfers: Sit to/from Stand Sit to Stand: Modified independent (Device/Increase time)            Ambulation/Gait Ambulation/Gait assistance: Supervision Gait Distance (Feet): 40 Feet Assistive device: Rolling walker (2 wheeled) Gait Pattern/deviations: Decreased step length - right;Decreased step length - left Gait velocity: decreased   General Gait Details: generally steady but limited by fatigue   Stairs              Wheelchair Mobility    Modified Rankin (Stroke Patients Only)       Balance Overall balance assessment: Needs assistance Sitting-balance support: Feet supported Sitting balance-Leahy Scale: Normal     Standing balance support: Bilateral upper extremity supported Standing balance-Leahy Scale: Fair                              Cognition Arousal/Alertness: Awake/alert Behavior During Therapy: WFL for tasks assessed/performed Overall Cognitive Status: Within Functional Limits for tasks assessed                                        Exercises Other Exercises Other Exercises: to bathroom to void.  Ind selfcare    General Comments        Pertinent Vitals/Pain Pain Assessment: No/denies pain    Home Living                      Prior Function            PT Goals (current goals can now be found in the care plan section) Progress towards PT goals: Progressing toward goals    Frequency    Min 2X/week      PT Plan Discharge plan needs to be updated    Co-evaluation              AM-PAC PT "6 Clicks" Mobility   Outcome Measure  Help needed turning from your back to your side while in a flat bed without using bedrails?: None Help needed moving from lying on your back to sitting on the side of a flat bed without using bedrails?: None Help needed moving to and from a bed to a chair (including a wheelchair)?: None Help needed standing up from a chair using your arms (e.g., wheelchair or bedside chair)?: None Help needed to walk in hospital room?: A Little Help needed climbing 3-5 steps with a railing? : A Little 6 Click Score: 22    End of Session   Activity Tolerance: Patient tolerated treatment well Patient left: in bed;with call bell/phone within reach;with bed alarm set Nurse Communication: Mobility status       Time: 0258-5277 PT Time Calculation (min) (ACUTE ONLY): 24 min  Charges:  $Gait Training:  23-37 mins                    Chesley Noon, PTA 07/18/20, 12:36 PM

## 2020-07-28 DIAGNOSIS — Z794 Long term (current) use of insulin: Secondary | ICD-10-CM | POA: Diagnosis not present

## 2020-07-28 DIAGNOSIS — J1282 Pneumonia due to coronavirus disease 2019: Secondary | ICD-10-CM | POA: Diagnosis not present

## 2020-07-28 DIAGNOSIS — U071 COVID-19: Secondary | ICD-10-CM | POA: Diagnosis not present

## 2020-07-28 DIAGNOSIS — E1165 Type 2 diabetes mellitus with hyperglycemia: Secondary | ICD-10-CM | POA: Diagnosis not present

## 2020-07-28 DIAGNOSIS — Z09 Encounter for follow-up examination after completed treatment for conditions other than malignant neoplasm: Secondary | ICD-10-CM | POA: Diagnosis not present

## 2020-08-07 DIAGNOSIS — E1165 Type 2 diabetes mellitus with hyperglycemia: Secondary | ICD-10-CM | POA: Diagnosis not present

## 2020-08-16 DIAGNOSIS — M79641 Pain in right hand: Secondary | ICD-10-CM | POA: Diagnosis not present

## 2020-08-16 DIAGNOSIS — M79642 Pain in left hand: Secondary | ICD-10-CM | POA: Diagnosis not present

## 2020-08-18 DIAGNOSIS — E1165 Type 2 diabetes mellitus with hyperglycemia: Secondary | ICD-10-CM | POA: Diagnosis not present

## 2020-08-18 DIAGNOSIS — E1122 Type 2 diabetes mellitus with diabetic chronic kidney disease: Secondary | ICD-10-CM | POA: Diagnosis not present

## 2020-08-18 DIAGNOSIS — N1831 Chronic kidney disease, stage 3a: Secondary | ICD-10-CM | POA: Diagnosis not present

## 2020-08-23 DIAGNOSIS — U071 COVID-19: Secondary | ICD-10-CM | POA: Diagnosis not present

## 2020-08-23 DIAGNOSIS — J189 Pneumonia, unspecified organism: Secondary | ICD-10-CM | POA: Diagnosis not present

## 2020-08-23 DIAGNOSIS — M549 Dorsalgia, unspecified: Secondary | ICD-10-CM | POA: Diagnosis not present

## 2020-08-23 DIAGNOSIS — N189 Chronic kidney disease, unspecified: Secondary | ICD-10-CM | POA: Diagnosis not present

## 2020-08-23 DIAGNOSIS — J1282 Pneumonia due to coronavirus disease 2019: Secondary | ICD-10-CM | POA: Diagnosis not present

## 2020-08-23 DIAGNOSIS — E1122 Type 2 diabetes mellitus with diabetic chronic kidney disease: Secondary | ICD-10-CM | POA: Diagnosis not present

## 2020-08-23 DIAGNOSIS — N3281 Overactive bladder: Secondary | ICD-10-CM | POA: Diagnosis not present

## 2020-09-07 DIAGNOSIS — E1165 Type 2 diabetes mellitus with hyperglycemia: Secondary | ICD-10-CM | POA: Diagnosis not present

## 2020-09-08 DIAGNOSIS — Z794 Long term (current) use of insulin: Secondary | ICD-10-CM | POA: Diagnosis not present

## 2020-09-08 DIAGNOSIS — E113293 Type 2 diabetes mellitus with mild nonproliferative diabetic retinopathy without macular edema, bilateral: Secondary | ICD-10-CM | POA: Diagnosis not present

## 2020-09-08 DIAGNOSIS — I700 Atherosclerosis of aorta: Secondary | ICD-10-CM | POA: Diagnosis not present

## 2020-09-08 DIAGNOSIS — E1165 Type 2 diabetes mellitus with hyperglycemia: Secondary | ICD-10-CM | POA: Diagnosis not present

## 2020-09-08 DIAGNOSIS — E1122 Type 2 diabetes mellitus with diabetic chronic kidney disease: Secondary | ICD-10-CM | POA: Diagnosis not present

## 2020-09-08 DIAGNOSIS — N1832 Chronic kidney disease, stage 3b: Secondary | ICD-10-CM | POA: Diagnosis not present

## 2020-09-08 DIAGNOSIS — E1169 Type 2 diabetes mellitus with other specified complication: Secondary | ICD-10-CM | POA: Diagnosis not present

## 2020-09-08 DIAGNOSIS — E1159 Type 2 diabetes mellitus with other circulatory complications: Secondary | ICD-10-CM | POA: Diagnosis not present

## 2020-09-08 DIAGNOSIS — E669 Obesity, unspecified: Secondary | ICD-10-CM | POA: Diagnosis not present

## 2020-09-14 DIAGNOSIS — N184 Chronic kidney disease, stage 4 (severe): Secondary | ICD-10-CM | POA: Diagnosis not present

## 2020-09-14 DIAGNOSIS — I129 Hypertensive chronic kidney disease with stage 1 through stage 4 chronic kidney disease, or unspecified chronic kidney disease: Secondary | ICD-10-CM | POA: Diagnosis not present

## 2020-09-14 DIAGNOSIS — E1165 Type 2 diabetes mellitus with hyperglycemia: Secondary | ICD-10-CM | POA: Diagnosis not present

## 2020-09-14 DIAGNOSIS — G43909 Migraine, unspecified, not intractable, without status migrainosus: Secondary | ICD-10-CM | POA: Diagnosis not present

## 2020-09-14 DIAGNOSIS — E1122 Type 2 diabetes mellitus with diabetic chronic kidney disease: Secondary | ICD-10-CM | POA: Diagnosis not present

## 2020-09-14 DIAGNOSIS — U071 COVID-19: Secondary | ICD-10-CM | POA: Diagnosis not present

## 2020-09-14 DIAGNOSIS — F329 Major depressive disorder, single episode, unspecified: Secondary | ICD-10-CM | POA: Diagnosis not present

## 2020-09-14 DIAGNOSIS — E1140 Type 2 diabetes mellitus with diabetic neuropathy, unspecified: Secondary | ICD-10-CM | POA: Diagnosis not present

## 2020-09-14 DIAGNOSIS — J1282 Pneumonia due to coronavirus disease 2019: Secondary | ICD-10-CM | POA: Diagnosis not present

## 2020-09-22 DIAGNOSIS — I129 Hypertensive chronic kidney disease with stage 1 through stage 4 chronic kidney disease, or unspecified chronic kidney disease: Secondary | ICD-10-CM | POA: Diagnosis not present

## 2020-09-22 DIAGNOSIS — F329 Major depressive disorder, single episode, unspecified: Secondary | ICD-10-CM | POA: Diagnosis not present

## 2020-09-22 DIAGNOSIS — E1122 Type 2 diabetes mellitus with diabetic chronic kidney disease: Secondary | ICD-10-CM | POA: Diagnosis not present

## 2020-09-22 DIAGNOSIS — E1140 Type 2 diabetes mellitus with diabetic neuropathy, unspecified: Secondary | ICD-10-CM | POA: Diagnosis not present

## 2020-09-22 DIAGNOSIS — U071 COVID-19: Secondary | ICD-10-CM | POA: Diagnosis not present

## 2020-09-22 DIAGNOSIS — J1282 Pneumonia due to coronavirus disease 2019: Secondary | ICD-10-CM | POA: Diagnosis not present

## 2020-09-22 DIAGNOSIS — E1165 Type 2 diabetes mellitus with hyperglycemia: Secondary | ICD-10-CM | POA: Diagnosis not present

## 2020-09-22 DIAGNOSIS — N184 Chronic kidney disease, stage 4 (severe): Secondary | ICD-10-CM | POA: Diagnosis not present

## 2020-09-22 DIAGNOSIS — G43909 Migraine, unspecified, not intractable, without status migrainosus: Secondary | ICD-10-CM | POA: Diagnosis not present

## 2020-09-24 DIAGNOSIS — E1165 Type 2 diabetes mellitus with hyperglycemia: Secondary | ICD-10-CM | POA: Diagnosis not present

## 2020-09-24 DIAGNOSIS — U071 COVID-19: Secondary | ICD-10-CM | POA: Diagnosis not present

## 2020-09-24 DIAGNOSIS — E1122 Type 2 diabetes mellitus with diabetic chronic kidney disease: Secondary | ICD-10-CM | POA: Diagnosis not present

## 2020-09-24 DIAGNOSIS — J1282 Pneumonia due to coronavirus disease 2019: Secondary | ICD-10-CM | POA: Diagnosis not present

## 2020-09-24 DIAGNOSIS — E1140 Type 2 diabetes mellitus with diabetic neuropathy, unspecified: Secondary | ICD-10-CM | POA: Diagnosis not present

## 2020-09-24 DIAGNOSIS — N184 Chronic kidney disease, stage 4 (severe): Secondary | ICD-10-CM | POA: Diagnosis not present

## 2020-09-24 DIAGNOSIS — I129 Hypertensive chronic kidney disease with stage 1 through stage 4 chronic kidney disease, or unspecified chronic kidney disease: Secondary | ICD-10-CM | POA: Diagnosis not present

## 2020-09-25 DIAGNOSIS — N184 Chronic kidney disease, stage 4 (severe): Secondary | ICD-10-CM | POA: Diagnosis not present

## 2020-09-25 DIAGNOSIS — F329 Major depressive disorder, single episode, unspecified: Secondary | ICD-10-CM | POA: Diagnosis not present

## 2020-09-25 DIAGNOSIS — I129 Hypertensive chronic kidney disease with stage 1 through stage 4 chronic kidney disease, or unspecified chronic kidney disease: Secondary | ICD-10-CM | POA: Diagnosis not present

## 2020-09-25 DIAGNOSIS — E1122 Type 2 diabetes mellitus with diabetic chronic kidney disease: Secondary | ICD-10-CM | POA: Diagnosis not present

## 2020-09-25 DIAGNOSIS — G43909 Migraine, unspecified, not intractable, without status migrainosus: Secondary | ICD-10-CM | POA: Diagnosis not present

## 2020-09-25 DIAGNOSIS — U071 COVID-19: Secondary | ICD-10-CM | POA: Diagnosis not present

## 2020-09-25 DIAGNOSIS — E1140 Type 2 diabetes mellitus with diabetic neuropathy, unspecified: Secondary | ICD-10-CM | POA: Diagnosis not present

## 2020-09-25 DIAGNOSIS — J1282 Pneumonia due to coronavirus disease 2019: Secondary | ICD-10-CM | POA: Diagnosis not present

## 2020-09-25 DIAGNOSIS — E1165 Type 2 diabetes mellitus with hyperglycemia: Secondary | ICD-10-CM | POA: Diagnosis not present

## 2020-10-05 ENCOUNTER — Emergency Department
Admission: EM | Admit: 2020-10-05 | Discharge: 2020-10-05 | Disposition: A | Discharge status: Home / Self Care | Payer: Medicare HMO | Attending: Emergency Medicine | Admitting: Emergency Medicine

## 2020-10-05 ENCOUNTER — Other Ambulatory Visit: Payer: Self-pay

## 2020-10-05 DIAGNOSIS — E1121 Type 2 diabetes mellitus with diabetic nephropathy: Secondary | ICD-10-CM | POA: Diagnosis not present

## 2020-10-05 DIAGNOSIS — E1165 Type 2 diabetes mellitus with hyperglycemia: Secondary | ICD-10-CM | POA: Diagnosis not present

## 2020-10-05 DIAGNOSIS — Z79899 Other long term (current) drug therapy: Secondary | ICD-10-CM | POA: Insufficient documentation

## 2020-10-05 DIAGNOSIS — R112 Nausea with vomiting, unspecified: Secondary | ICD-10-CM | POA: Diagnosis not present

## 2020-10-05 DIAGNOSIS — Z7982 Long term (current) use of aspirin: Secondary | ICD-10-CM | POA: Diagnosis not present

## 2020-10-05 DIAGNOSIS — N184 Chronic kidney disease, stage 4 (severe): Secondary | ICD-10-CM | POA: Insufficient documentation

## 2020-10-05 DIAGNOSIS — K920 Hematemesis: Secondary | ICD-10-CM | POA: Insufficient documentation

## 2020-10-05 DIAGNOSIS — R197 Diarrhea, unspecified: Secondary | ICD-10-CM | POA: Insufficient documentation

## 2020-10-05 DIAGNOSIS — Z794 Long term (current) use of insulin: Secondary | ICD-10-CM | POA: Diagnosis not present

## 2020-10-05 DIAGNOSIS — I129 Hypertensive chronic kidney disease with stage 1 through stage 4 chronic kidney disease, or unspecified chronic kidney disease: Secondary | ICD-10-CM | POA: Insufficient documentation

## 2020-10-05 DIAGNOSIS — R1013 Epigastric pain: Secondary | ICD-10-CM | POA: Diagnosis not present

## 2020-10-05 DIAGNOSIS — E1122 Type 2 diabetes mellitus with diabetic chronic kidney disease: Secondary | ICD-10-CM | POA: Diagnosis not present

## 2020-10-05 DIAGNOSIS — Z8673 Personal history of transient ischemic attack (TIA), and cerebral infarction without residual deficits: Secondary | ICD-10-CM | POA: Insufficient documentation

## 2020-10-05 DIAGNOSIS — Z87891 Personal history of nicotine dependence: Secondary | ICD-10-CM | POA: Diagnosis not present

## 2020-10-05 DIAGNOSIS — Z8616 Personal history of COVID-19: Secondary | ICD-10-CM | POA: Insufficient documentation

## 2020-10-05 LAB — CBC WITH DIFFERENTIAL/PLATELET
Abs Immature Granulocytes: 0.04 10*3/uL (ref 0.00–0.07)
Basophils Absolute: 0.1 10*3/uL (ref 0.0–0.1)
Basophils Relative: 1 %
Eosinophils Absolute: 0.1 10*3/uL (ref 0.0–0.5)
Eosinophils Relative: 1 %
HCT: 40.1 % (ref 36.0–46.0)
Hemoglobin: 13.5 g/dL (ref 12.0–15.0)
Immature Granulocytes: 1 %
Lymphocytes Relative: 21 %
Lymphs Abs: 1.8 10*3/uL (ref 0.7–4.0)
MCH: 28.8 pg (ref 26.0–34.0)
MCHC: 33.7 g/dL (ref 30.0–36.0)
MCV: 85.5 fL (ref 80.0–100.0)
Monocytes Absolute: 0.4 10*3/uL (ref 0.1–1.0)
Monocytes Relative: 5 %
Neutro Abs: 6 10*3/uL (ref 1.7–7.7)
Neutrophils Relative %: 71 %
Platelets: 206 10*3/uL (ref 150–400)
RBC: 4.69 MIL/uL (ref 3.87–5.11)
RDW: 14.1 % (ref 11.5–15.5)
WBC: 8.4 10*3/uL (ref 4.0–10.5)
nRBC: 0 % (ref 0.0–0.2)

## 2020-10-05 LAB — LIPASE, BLOOD: Lipase: 19 U/L (ref 11–51)

## 2020-10-05 LAB — COMPREHENSIVE METABOLIC PANEL
ALT: 19 U/L (ref 0–44)
AST: 26 U/L (ref 15–41)
Albumin: 3.9 g/dL (ref 3.5–5.0)
Alkaline Phosphatase: 80 U/L (ref 38–126)
Anion gap: 13 (ref 5–15)
BUN: 21 mg/dL (ref 8–23)
CO2: 18 mmol/L — ABNORMAL LOW (ref 22–32)
Calcium: 9.4 mg/dL (ref 8.9–10.3)
Chloride: 108 mmol/L (ref 98–111)
Creatinine, Ser: 1.33 mg/dL — ABNORMAL HIGH (ref 0.44–1.00)
GFR, Estimated: 44 mL/min — ABNORMAL LOW (ref >60)
Glucose, Bld: 341 mg/dL — ABNORMAL HIGH (ref 70–99)
Potassium: 4.3 mmol/L (ref 3.5–5.1)
Sodium: 139 mmol/L (ref 135–145)
Total Bilirubin: 0.9 mg/dL (ref 0.3–1.2)
Total Protein: 7.6 g/dL (ref 6.5–8.1)

## 2020-10-05 MED ORDER — ONDANSETRON HCL 4 MG/2ML IJ SOLN
4.0000 mg | Freq: Once | INTRAMUSCULAR | Status: AC
  Administered 2020-10-05: 4 mg via INTRAVENOUS
  Filled 2020-10-05: qty 2

## 2020-10-05 MED ORDER — ONDANSETRON 4 MG PO TBDP: 4.0000 mg | ORAL_TABLET | Freq: Three times a day (TID) | ORAL | 0 refills | Status: AC | PRN

## 2020-10-05 MED ORDER — SODIUM CHLORIDE 0.9 % IV BOLUS
1000.0000 mL | Freq: Once | INTRAVENOUS | Status: AC
  Administered 2020-10-05: 1000 mL via INTRAVENOUS

## 2020-10-05 NOTE — ED Provider Notes (Signed)
[facility] Emergency Department Provider Note   ____________________________________________   Event Date/Time   First MD Initiated Contact with Patient 10/05/20 [phone]     (approximate)  I have reviewed the triage vital signs and the nursing notes.   HISTORY  Chief Complaint Hematemesis    HPI Mariah Forbes is a 65 y.o. female with stated past medical history of CKD, diabetes, hypertension, and hyperlipidemia who presents for 2 days of vomiting and diarrhea.  Patient states that she noticed blood in her emesis this morning and is concerned as she is currently taking aspirin daily.  Patient states that she has mild epigastric, burning, aching, nonradiating abdominal pain that has no exacerbating or relieving factors.  Patient states that she has been intolerant of all p.o. intake for the last 48 hours.  Patient currently denies any vision changes, tinnitus, difficulty speaking, facial droop, sore throat, chest pain, shortness of breath, dysuria, or weakness/numbness/paresthesias in any extremity         Past Medical History:  Diagnosis Date  . CKD (chronic kidney disease) stage 4, GFR 15-29 ml/min (HCC)   . CVA (cerebral vascular accident) [facility])    reported as 4 strokes since 2009  . DM (diabetes mellitus) type II controlled with renal manifestation ([place])   . GERD (gastroesophageal reflux disease)   . HLD (hyperlipidemia)   . Hypertension   . Migraine     Patient Active Problem List   Diagnosis Date Noted  . LOC (loss of consciousness) ([place]) 07/13/2020  . DM2 (diabetes mellitus, type 2) ([place]) 07/13/2020  . Diabetic nephropathy associated with type 2 diabetes mellitus ([place]) 07/13/2020  . Cardiac arrhythmia 07/13/2020  . GERD (gastroesophageal reflux disease) 07/13/2020  . COVID-19 virus infection 07/13/2020  . Cystitis 07/13/2020    Past Surgical History:  Procedure Laterality Date  . ABDOMINAL HYSTERECTOMY    . APPENDECTOMY    . BREAST  EXCISIONAL BIOPSY Bilateral    2 benign lumps, 1980 and 2015  . BREAST LUMPECTOMY Right     Prior to Admission medications   Medication Sig Start Date End Date Taking? Authorizing Provider  ondansetron (ZOFRAN ODT) 4 MG disintegrating tablet Take 1 tablet (4 mg total) by mouth every 8 (eight) hours as needed for nausea or vomiting. 10/05/20  Yes Bradler, [place] Lawman, MD  aspirin 81 MG chewable tablet Chew 81 mg by mouth daily.    [provider]  atorvastatin (LIPITOR) 40 MG tablet Take 40 mg by mouth at bedtime. 05/12/20   [provider]  famotidine (PEPCID) 20 MG tablet Take 20 mg by mouth 2 (two) times daily. 07/04/20   [provider]  FLUoxetine (PROZAC) 20 MG capsule Take 20 mg by mouth daily. 06/25/20   [provider]  gabapentin (NEURONTIN) 300 MG capsule Take 300 mg by mouth 2 (two) times daily. 06/22/20   [provider]  hydrochlorothiazide (HYDRODIURIL) 12.5 MG tablet Take 12.5 mg by mouth daily. 05/12/20   [provider]  JARDIANCE 25 MG TABS tablet Take 25 mg by mouth daily. Patient not taking: No sig reported 06/06/20   [provider]  LEVEMIR FLEXTOUCH 100 UNIT/ML FlexPen Inject 65 Units into the skin daily. 07/11/20   [provider]  losartan (COZAAR) 100 MG tablet Take 100 mg by mouth daily. 05/17/20   [provider]  meclizine (ANTIVERT) 12.5 MG tablet Take 12.5 mg by mouth 2 (two) times daily. 02/09/20   [provider]  metoprolol tartrate (LOPRESSOR) 50  MG tablet Take 50 mg by mouth 2 (two) times daily. 02/13/20   [provider]  MYRBETRIQ 25 MG TB24 tablet Take 25 mg by mouth daily. Patient not taking: No sig reported 07/11/20   [provider]  NOVOLOG FLEXPEN 100 UNIT/ML FlexPen Inject 15-20 Units into the skin 3 (three) times daily. 06/03/20   [provider]  ondansetron (ZOFRAN ODT) 4 MG disintegrating tablet Take 1 tablet (4 mg total) by mouth every 8  (eight) hours as needed for nausea or vomiting. 07/11/20   Naaman Plummer, MD  pantoprazole (PROTONIX) 40 MG tablet Take 40 mg by mouth daily. Patient not taking: No sig reported 05/17/20   [provider]  TOVIAZ 8 MG TB24 tablet Take 8 mg by mouth daily. 06/25/20   [provider]  traZODone (DESYREL) 100 MG tablet Take 50 mg by mouth at bedtime. 07/04/20   [provider]  VICTOZA 18 MG/3ML SOPN Inject 0.6-1.8 mg into the skin daily. Patient not taking: No sig reported 03/03/20   [provider]    Allergies Lisinopril, Other, and Ace inhibitors  Family History  Problem Relation Age of Onset  . Cerebral aneurysm Mother   . Lymphoma Father   . Diabetes Child   . Breast cancer Neg Hx     Social History Social History   Tobacco Use  . Smoking status: Former Research scientist (life sciences)  . Smokeless tobacco: Never Used  Substance Use Topics  . Alcohol use: Yes  . Drug use: Yes    Frequency: 1.0 times per week    Types: Marijuana    Review of Systems Constitutional: No fever/chills Eyes: No visual changes. ENT: No sore throat. Cardiovascular: Denies chest pain. Respiratory: Denies shortness of breath. Gastrointestinal: Endorses abdominal pain.  Endorses nausea/vomiting/diarrhea Genitourinary: Negative for dysuria. Musculoskeletal: Negative for acute arthralgias Skin: Negative for rash. Neurological: Negative for headaches, weakness/numbness/paresthesias in any extremity Psychiatric: Negative for suicidal ideation/homicidal ideation   ____________________________________________   PHYSICAL EXAM:  VITAL SIGNS: ED Triage Vitals  Enc Vitals Group     BP 10/05/20 0903 (!) 194/101     Pulse Rate 10/05/20 0900 89     Resp 10/05/20 0900 18     Temp 10/05/20 0900 (!) 97.4 F (36.3 C)     Temp Source 10/05/20 0900 Oral     SpO2 10/05/20 0900 95 %     Weight 10/05/20 0902 210 lb (95.3 kg)     Height 10/05/20 0902 '5\' 3"'$  (1.6 m)     Head Circumference --       Peak Flow --      Pain Score 10/05/20 0902 0     Pain Loc --      Pain Edu? --      Excl. in [place]? --    Constitutional: Alert and oriented. Well appearing and in no acute distress. Eyes: Conjunctivae are normal. PERRL. Head: Atraumatic. Nose: No congestion/rhinnorhea. Mouth/Throat: Mucous membranes are moist. Neck: No stridor Cardiovascular: Grossly normal heart sounds.  Good peripheral circulation. Respiratory: Normal respiratory effort.  No retractions. Gastrointestinal: Soft and nontender. No distention. Musculoskeletal: No obvious deformities Neurologic:  Normal speech and language. No gross focal neurologic deficits are appreciated. Skin:  Skin is warm and dry. No rash noted. Psychiatric: Mood and affect are normal. Speech and behavior are normal.  ____________________________________________   LABS (all labs ordered are listed, but only abnormal results are displayed)  Labs Reviewed  COMPREHENSIVE METABOLIC PANEL - Abnormal; Notable for the following components:  Result Value   CO2 18 (*)    Glucose, Bld 341 (*)    Creatinine, Ser 1.33 (*)    GFR, Estimated 44 (*)    All other components within normal limits  LIPASE, BLOOD  CBC WITH DIFFERENTIAL/PLATELET  URINALYSIS, COMPLETE (UACMP) WITH MICROSCOPIC  TYPE AND SCREEN   PROCEDURES  Procedure(s) performed (including Critical Care):  .1-3 Lead EKG Interpretation Performed by: Naaman Plummer, MD Authorized by: Naaman Plummer, MD     Interpretation: normal     ECG rate:  70   ECG rate assessment: normal     Rhythm: sinus rhythm     Ectopy: none     Conduction: normal       ____________________________________________   INITIAL IMPRESSION / ASSESSMENT AND PLAN / ED COURSE  As part of my medical decision making, I reviewed the following data within the [facility] notes reviewed and incorporated, Labs reviewed, Old chart reviewed, and Notes from prior ED visits reviewed and  incorporated        Patient presents for acute nausea/vomiting The cause of the patients symptoms is not clear, but the patient is overall well appearing and is suspected to have a transient course of illness.  Given History and Exam there does not appear to be an emergent cause of the symptoms such as small bowel obstruction, coronary syndrome, bowel ischemia, DKA, pancreatitis, appendicitis, other acute abdomen or other emergent problem.  Reassessment: After treatment, the patient is feeling much better, tolerating PO fluids, and shows no signs of dehydration.   Disposition: Discharge home with prompt primary care physician follow up in the next 48 hours. Strict return precautions discussed.      ____________________________________________   FINAL CLINICAL IMPRESSION(S) / ED DIAGNOSES  Final diagnoses:  Nausea vomiting and diarrhea  Hematemesis with nausea     ED Discharge Orders         Ordered    ondansetron (ZOFRAN ODT) 4 MG disintegrating tablet  Every 8 hours PRN        10/05/20 1031           Note:  This document was prepared using Dragon voice recognition software and may include unintentional dictation errors.   Naaman Plummer, MD 10/05/20 1150

## 2020-10-05 NOTE — ED Notes (Signed)
Discharge instructions reviewed , pt calm , collective  Denied pain or sob

## 2020-10-05 NOTE — ED Triage Notes (Signed)
Pt states vomiting x 2 days, noticed blood in emesis this AM. Takes ASA but no blood thinners. A&O, ambulatory. Denies fever.

## 2020-10-08 DIAGNOSIS — E1122 Type 2 diabetes mellitus with diabetic chronic kidney disease: Secondary | ICD-10-CM | POA: Diagnosis not present

## 2020-10-08 DIAGNOSIS — J1282 Pneumonia due to coronavirus disease 2019: Secondary | ICD-10-CM | POA: Diagnosis not present

## 2020-10-08 DIAGNOSIS — E1140 Type 2 diabetes mellitus with diabetic neuropathy, unspecified: Secondary | ICD-10-CM | POA: Diagnosis not present

## 2020-10-08 DIAGNOSIS — G43909 Migraine, unspecified, not intractable, without status migrainosus: Secondary | ICD-10-CM | POA: Diagnosis not present

## 2020-10-08 DIAGNOSIS — I129 Hypertensive chronic kidney disease with stage 1 through stage 4 chronic kidney disease, or unspecified chronic kidney disease: Secondary | ICD-10-CM | POA: Diagnosis not present

## 2020-10-08 DIAGNOSIS — E1165 Type 2 diabetes mellitus with hyperglycemia: Secondary | ICD-10-CM | POA: Diagnosis not present

## 2020-10-08 DIAGNOSIS — F329 Major depressive disorder, single episode, unspecified: Secondary | ICD-10-CM | POA: Diagnosis not present

## 2020-10-08 DIAGNOSIS — N184 Chronic kidney disease, stage 4 (severe): Secondary | ICD-10-CM | POA: Diagnosis not present

## 2020-10-08 DIAGNOSIS — U071 COVID-19: Secondary | ICD-10-CM | POA: Diagnosis not present

## 2020-10-18 DIAGNOSIS — E669 Obesity, unspecified: Secondary | ICD-10-CM | POA: Diagnosis not present

## 2020-10-18 DIAGNOSIS — E1169 Type 2 diabetes mellitus with other specified complication: Secondary | ICD-10-CM | POA: Diagnosis not present

## 2020-10-18 DIAGNOSIS — E1122 Type 2 diabetes mellitus with diabetic chronic kidney disease: Secondary | ICD-10-CM | POA: Diagnosis not present

## 2020-10-18 DIAGNOSIS — E113293 Type 2 diabetes mellitus with mild nonproliferative diabetic retinopathy without macular edema, bilateral: Secondary | ICD-10-CM | POA: Diagnosis not present

## 2020-10-18 DIAGNOSIS — E1165 Type 2 diabetes mellitus with hyperglycemia: Secondary | ICD-10-CM | POA: Diagnosis not present

## 2020-10-18 DIAGNOSIS — E1159 Type 2 diabetes mellitus with other circulatory complications: Secondary | ICD-10-CM | POA: Diagnosis not present

## 2020-10-18 DIAGNOSIS — N1832 Chronic kidney disease, stage 3b: Secondary | ICD-10-CM | POA: Diagnosis not present

## 2020-10-18 DIAGNOSIS — Z794 Long term (current) use of insulin: Secondary | ICD-10-CM | POA: Diagnosis not present

## 2020-10-18 DIAGNOSIS — I700 Atherosclerosis of aorta: Secondary | ICD-10-CM | POA: Diagnosis not present

## 2020-11-05 DIAGNOSIS — E1165 Type 2 diabetes mellitus with hyperglycemia: Secondary | ICD-10-CM | POA: Diagnosis not present

## 2020-12-05 DIAGNOSIS — E1165 Type 2 diabetes mellitus with hyperglycemia: Secondary | ICD-10-CM | POA: Diagnosis not present

## 2020-12-20 DIAGNOSIS — N1832 Chronic kidney disease, stage 3b: Secondary | ICD-10-CM | POA: Diagnosis not present

## 2020-12-20 DIAGNOSIS — E119 Type 2 diabetes mellitus without complications: Secondary | ICD-10-CM | POA: Diagnosis not present

## 2020-12-20 DIAGNOSIS — R0600 Dyspnea, unspecified: Secondary | ICD-10-CM | POA: Diagnosis not present

## 2020-12-20 DIAGNOSIS — Z794 Long term (current) use of insulin: Secondary | ICD-10-CM | POA: Diagnosis not present

## 2020-12-20 DIAGNOSIS — I10 Essential (primary) hypertension: Secondary | ICD-10-CM | POA: Diagnosis not present

## 2021-01-05 DIAGNOSIS — E1165 Type 2 diabetes mellitus with hyperglycemia: Secondary | ICD-10-CM | POA: Diagnosis not present
